# Patient Record
Sex: Male | Born: 1955
Health system: Southern US, Community
[De-identification: ages and names within clinical notes are randomized; demographics above are authoritative.]

## PROBLEM LIST (undated history)

## (undated) DIAGNOSIS — Z21 Asymptomatic human immunodeficiency virus [HIV] infection status: Secondary | ICD-10-CM

## (undated) DIAGNOSIS — M1990 Unspecified osteoarthritis, unspecified site: Secondary | ICD-10-CM

## (undated) DIAGNOSIS — M722 Plantar fascial fibromatosis: Secondary | ICD-10-CM

## (undated) DIAGNOSIS — Z86010 Personal history of colonic polyps: Secondary | ICD-10-CM

## (undated) DIAGNOSIS — B20 Human immunodeficiency virus [HIV] disease: Secondary | ICD-10-CM

## (undated) HISTORY — DX: Asymptomatic human immunodeficiency virus (hiv) infection status: Z21

## (undated) HISTORY — DX: Personal history of colonic polyps: Z86.010

## (undated) HISTORY — PX: COLONOSCOPY: SHX174

## (undated) HISTORY — PX: TONSILLECTOMY: SUR1361

## (undated) HISTORY — PX: PROSTATE BIOPSY: SHX241

## (undated) HISTORY — DX: Unspecified osteoarthritis, unspecified site: M19.90

## (undated) HISTORY — DX: Plantar fascial fibromatosis: M72.2

## (undated) HISTORY — DX: Human immunodeficiency virus (HIV) disease: B20

## (undated) HISTORY — PX: HEMORRHOID BANDING: SHX5850

---

## 2002-11-01 ENCOUNTER — Ambulatory Visit (HOSPITAL_BASED_OUTPATIENT_CLINIC_OR_DEPARTMENT_OTHER): Admission: RE | Admit: 2002-11-01 | Discharge: 2002-11-01 | Payer: Self-pay | Admitting: General Surgery

## 2002-12-01 ENCOUNTER — Encounter: Payer: Self-pay | Admitting: Internal Medicine

## 2002-12-01 ENCOUNTER — Inpatient Hospital Stay (HOSPITAL_COMMUNITY): Admission: EM | Admit: 2002-12-01 | Discharge: 2002-12-05 | Payer: Self-pay | Admitting: Emergency Medicine

## 2003-07-14 ENCOUNTER — Ambulatory Visit (HOSPITAL_COMMUNITY): Admission: RE | Admit: 2003-07-14 | Discharge: 2003-07-14 | Payer: Self-pay | Admitting: Hematology & Oncology

## 2011-05-01 ENCOUNTER — Telehealth: Payer: Self-pay

## 2011-05-01 ENCOUNTER — Ambulatory Visit (INDEPENDENT_AMBULATORY_CARE_PROVIDER_SITE_OTHER): Payer: BC Managed Care – PPO | Admitting: Emergency Medicine

## 2011-05-01 VITALS — BP 130/84 | HR 90 | Temp 97.7°F | Resp 16 | Ht 70.0 in | Wt 203.0 lb

## 2011-05-01 DIAGNOSIS — Z21 Asymptomatic human immunodeficiency virus [HIV] infection status: Secondary | ICD-10-CM

## 2011-05-01 DIAGNOSIS — B20 Human immunodeficiency virus [HIV] disease: Secondary | ICD-10-CM

## 2011-05-01 DIAGNOSIS — Z202 Contact with and (suspected) exposure to infections with a predominantly sexual mode of transmission: Secondary | ICD-10-CM

## 2011-05-01 LAB — HEPATITIS C ANTIBODY: HCV Ab: NEGATIVE

## 2011-05-01 MED ORDER — DOXYCYCLINE HYCLATE 100 MG PO TABS: 100.0000 mg | ORAL_TABLET | Freq: Two times a day (BID) | ORAL | Status: AC

## 2011-05-01 MED ORDER — CEFTRIAXONE SODIUM 1 G IJ SOLR
250.0000 mg | Freq: Once | INTRAMUSCULAR | Status: AC
  Administered 2011-05-01: 250 mg via INTRAMUSCULAR

## 2011-05-01 MED ORDER — CEFTRIAXONE SODIUM 250 MG IJ SOLR: 250.0000 mg | Freq: Once | INTRAMUSCULAR | Status: DC

## 2011-05-01 MED ORDER — VALACYCLOVIR HCL 1 G PO TABS: 1000.0000 mg | ORAL_TABLET | Freq: Two times a day (BID) | ORAL | Status: DC

## 2011-05-01 NOTE — Progress Notes (Signed)
  Subjective:    Patient ID: Charles [name], male    DOB: 12/18/1955, 56 y.o.   MRN: [id number]  HPI patient enters after having anal intercourse . Following this the patient developed a painful rash around his anal area. He says his partner did use a condom. He is known to be HIV positive. Partner told him he tested positive for gonorrhea and Chlamydia about 2 months ago. He is under treatment for his HIV disease for a ID physician in [place]. His last CD4 count was 650.    Review of Systems he overall feels fine his only complaint is of a painful rash  is around his anal area.     Objective:   Physical Exam physical exam is limited to the genitals there are ulcerations present to the left of the anal area a swab was done for gonorrhea and Chlamydia from the anus. A Gen-Probe was also obtained. The ulcerations are swab and sent for HSV culture..        Assessment & Plan:  Assessment his perianal ulcerations in an HIV-positive male. Patient is to have had contact with the patient who had gonorrhea and Chlamydia. We'll do all his cultures but not a HIV test because he is known to be  HIV-positive. We'll give IM Rocephin treatment doxy and.valtrex.

## 2011-05-01 NOTE — Progress Notes (Signed)
Addended by: Lesle Chris A on: 05/01/2011 08:47 AM   Modules accepted: Orders

## 2011-05-02 LAB — HSV(HERPES SIMPLEX VRS) I + II AB-IGG: HSV 1 Glycoprotein G Ab, IgG: 3.76 IV — ABNORMAL HIGH

## 2011-05-02 LAB — GC/CHLAMYDIA PROBE AMP, GENITAL: Chlamydia, DNA Probe: NEGATIVE

## 2011-05-04 LAB — WOUND CULTURE
Gram Stain: NONE SEEN
Organism ID, Bacteria: NORMAL

## 2011-12-06 ENCOUNTER — Ambulatory Visit (INDEPENDENT_AMBULATORY_CARE_PROVIDER_SITE_OTHER): Payer: BC Managed Care – PPO | Admitting: Family Medicine

## 2011-12-06 VITALS — BP 122/81 | HR 63 | Temp 98.3°F | Resp 16 | Ht 70.0 in | Wt 205.6 lb

## 2011-12-06 DIAGNOSIS — L0291 Cutaneous abscess, unspecified: Secondary | ICD-10-CM

## 2011-12-06 DIAGNOSIS — A6000 Herpesviral infection of urogenital system, unspecified: Secondary | ICD-10-CM

## 2011-12-06 DIAGNOSIS — Z202 Contact with and (suspected) exposure to infections with a predominantly sexual mode of transmission: Secondary | ICD-10-CM

## 2011-12-06 MED ORDER — CEPHALEXIN 500 MG PO CAPS: 500.0000 mg | ORAL_CAPSULE | Freq: Four times a day (QID) | ORAL | Status: DC

## 2011-12-06 MED ORDER — VALACYCLOVIR HCL 1 G PO TABS: 1000.0000 mg | ORAL_TABLET | Freq: Two times a day (BID) | ORAL | Status: DC

## 2011-12-06 MED ORDER — SULFAMETHOXAZOLE-TRIMETHOPRIM 800-160 MG PO TABS: 2.0000 | ORAL_TABLET | Freq: Two times a day (BID) | ORAL | Status: DC

## 2011-12-06 NOTE — Progress Notes (Signed)
   Patient ID: Charles [name] MRN: [id number], DOB: 1955-05-15, 56 y.o. Date of Encounter: 12/06/2011, 6:53 PM    PROCEDURE NOTE: Verbal consent obtained. Betadine prep per usual protocol. Local anesthesia obtained with 1% plain lidocaine.  Small incision made with 11 blade along lesion.  Culture taken. Small amount of purulence expressed. Wound debrided. Irrigated with normal saline. Dressed. Wound care instructions including precautions with patient. Patient tolerated the procedure well.  SignedEula Listen, PA-C 12/06/2011 6:53 PM

## 2011-12-06 NOTE — Progress Notes (Signed)
  Subjective:    Patient ID: Charles [name], male    DOB: October 19, 1955, 56 y.o.   MRN: [id number] Chief Complaint  Patient presents with  . Cellulitis    right elbow    HPI  Has had mass on elbow that has become progressively swollen, warm, tender over the past wk.  Has not drained anything. No prob w/ elbow before the started or pre-existing masses.  Past Medical History  Diagnosis Date  . HIV positive   . Arthritis    Current Outpatient Prescriptions on File Prior to Visit  Medication Sig Dispense Refill  . Multiple Vitamin (MULTIVITAMIN) tablet Take 1 tablet by mouth daily.       No current facility-administered medications on file prior to visit.   No Known Allergies   Review of Systems  Constitutional: Negative for fever, chills, activity change and appetite change.  Cardiovascular: Negative for leg swelling.  Gastrointestinal: Negative for nausea, vomiting, abdominal pain, diarrhea and constipation.  Musculoskeletal: Positive for myalgias. Negative for joint swelling and gait problem.  Skin: Positive for rash and wound.  Neurological: Negative for weakness and numbness.  Hematological: Negative for adenopathy. Does not bruise/bleed easily.      BP 122/81  Pulse 63  Temp(Src) 98.3 F (36.8 C) (Oral)  Resp 16  Ht 5\' 10"  (1.778 m)  Wt 205 lb 9.6 oz (93.26 kg)  BMI 29.5 kg/m2 Objective:   Physical Exam  Constitutional: He is oriented to person, place, and time. He appears well-developed and well-nourished. No distress.  HENT:  Head: Normocephalic and atraumatic.  Eyes: No scleral icterus.  Pulmonary/Chest: Effort normal.  Neurological: He is alert and oriented to person, place, and time.  Skin: Skin is warm and dry. Rash noted. Rash is macular and nodular. He is not diaphoretic. There is erythema.  Right elbow with erythema, warmth, tender and central fluctuance.  Psychiatric: He has a normal mood and affect. His behavior is normal.          Assessment &  Plan:  Cellulitis and abscess - Plan: cephALEXin (KEFLEX) 500 MG capsule, sulfamethoxazole-trimethoprim (BACTRIM DS,SEPTRA DS) 800-160 MG per tablet, Wound culture - Will double cover with antibiotics since pt may be immunosupressed as PMHx notes HIV +.  S/p I&D by Alycia Rossetti today.  Herpes genitalia - Plan: valACYclovir (VALTREX) 1000 MG tablet refilled  Meds ordered this encounter  Medications  . valACYclovir (VALTREX) 1000 MG tablet    Sig: Take 1 tablet (1,000 mg total) by mouth 2 (two) times daily.    Dispense:  20 tablet    Refill:  11  . cephALEXin (KEFLEX) 500 MG capsule    Sig: Take 1 capsule (500 mg total) by mouth 4 (four) times daily.    Dispense:  28 capsule    Refill:  1  . sulfamethoxazole-trimethoprim (BACTRIM DS,SEPTRA DS) 800-160 MG per tablet    Sig: Take 2 tablets by mouth 2 (two) times daily.    Dispense:  40 tablet    Refill:  0

## 2011-12-18 ENCOUNTER — Ambulatory Visit (INDEPENDENT_AMBULATORY_CARE_PROVIDER_SITE_OTHER): Payer: BC Managed Care – PPO | Admitting: Family Medicine

## 2011-12-18 ENCOUNTER — Encounter: Payer: Self-pay | Admitting: Family Medicine

## 2011-12-18 VITALS — BP 118/76 | HR 75 | Temp 98.2°F | Resp 16 | Ht 70.0 in | Wt 198.8 lb

## 2011-12-18 DIAGNOSIS — L723 Sebaceous cyst: Secondary | ICD-10-CM

## 2011-12-18 DIAGNOSIS — L0291 Cutaneous abscess, unspecified: Secondary | ICD-10-CM

## 2011-12-18 DIAGNOSIS — L0390 Cellulitis, unspecified: Secondary | ICD-10-CM

## 2011-12-18 DIAGNOSIS — L02419 Cutaneous abscess of limb, unspecified: Secondary | ICD-10-CM

## 2011-12-18 NOTE — Progress Notes (Signed)
  Subjective:    Patient ID: Charles [name], male    DOB: 1956-01-26, 56 y.o.   MRN: [id number]  HPI Charles [name] is a 56 y.o. male  Treated 12/06/11 for R elbow abcess with I and D, Septra 2 BID for 10 days. Culture grew Staph.  Improved - seems to be ok now, no further pus - last expressed 4 days ago.  Pain resolved - finished antibiotic 2 days ago.  Today - c/o cyst on back left side of scalp - noticed about 2 weeks ago - seemed to slightly improve, then slightly larger in past 1 week.  No by much.  No pain, no soreness with pressure.  No discharge.   Possible similar cyst years ago - thinks it resolved on own.  No treatments to current lesion.   Review of Systems  Constitutional: Negative for fever and chills.  Skin: Negative for rash.  Neurological: Negative for headaches.       Objective:   Physical Exam  Vitals reviewed. Constitutional: He is oriented to person, place, and time. He appears well-developed and well-nourished.  HENT:  Head: Atraumatic.    Right Ear: External ear normal.  Left Ear: External ear normal.  Neck: Normal range of motion.  Musculoskeletal: Normal range of motion.       Right elbow: He exhibits normal range of motion, no swelling and no effusion. no tenderness found.       Arms: Lymphadenopathy:    He has no cervical adenopathy.  Neurological: He is alert and oriented to person, place, and time.  Skin: Skin is warm and dry.  Psychiatric: He has a normal mood and affect. His behavior is normal.       Assessment & Plan:  Charles [name] is a 56 y.o. male Likely sebaceous cyst L occiput  - does not appear infected or painful at this point.  rtc precautions discussed for I and D/infected s/sx.  R elbow cellulitis/abcess.  Improved and resolving off abx.  From. rtc precautions.

## 2012-06-09 ENCOUNTER — Ambulatory Visit (INDEPENDENT_AMBULATORY_CARE_PROVIDER_SITE_OTHER): Payer: BC Managed Care – PPO | Admitting: Family Medicine

## 2012-06-09 VITALS — BP 108/69 | HR 84 | Temp 98.2°F | Resp 16 | Ht 70.0 in | Wt 200.0 lb

## 2012-06-09 DIAGNOSIS — M7021 Olecranon bursitis, right elbow: Secondary | ICD-10-CM

## 2012-06-09 DIAGNOSIS — M7020 Olecranon bursitis, unspecified elbow: Secondary | ICD-10-CM

## 2012-06-09 DIAGNOSIS — R42 Dizziness and giddiness: Secondary | ICD-10-CM

## 2012-06-09 NOTE — Progress Notes (Signed)
  Subjective:    Patient ID: Charles [name], male    DOB: 12/24/1955, 57 y.o.   MRN: [id number] Chief Complaint  Patient presents with  . Joint Swelling    Rt elbow- 2 weeks- NKI- not really painful    HPI  For the past 2 wks, has had progressive swelling over his Rt elbow.  Slight limitation in complete flexion. No pain, no known injury. Has never had anything prior though did have an abscess over Rt elbow requiring I&D 6 mos prev - completely resolved.  Has been taking claritin -D daily for allergies but forgot it today.  Has felt a little lightheaded and nauseas today.   Past Medical History  Diagnosis Date  . HIV positive   . Arthritis    Current Outpatient Prescriptions on File Prior to Visit  Medication Sig Dispense Refill  . Multiple Vitamin (MULTIVITAMIN) tablet Take 1 tablet by mouth daily.      . valACYclovir (VALTREX) 1000 MG tablet Take 1 tablet (1,000 mg total) by mouth 2 (two) times daily.  20 tablet  11   No current facility-administered medications on file prior to visit.   No Known Allergies   Review of Systems  Constitutional: Negative for fever, chills, diaphoresis, activity change and appetite change.  Musculoskeletal: Positive for joint swelling. Negative for myalgias, arthralgias and gait problem.  Skin: Negative for color change, pallor, rash and wound.  Neurological: Negative for weakness and numbness.  Hematological: Does not bruise/bleed easily.       BP 108/69  Pulse 84  Temp(Src) 98.2 F (36.8 C) (Oral)  Resp 16  Ht 5\' 10"  (1.778 m)  Wt 200 lb (90.719 kg)  BMI 28.7 kg/m2  SpO2 98% Objective:   Physical Exam  Constitutional: He is oriented to person, place, and time. He appears well-developed and well-nourished. No distress.  HENT:  Head: Normocephalic and atraumatic.  Eyes: No scleral icterus.  Pulmonary/Chest: Effort normal.  Musculoskeletal:       Right elbow: He exhibits decreased range of motion and effusion. He exhibits no  deformity and no laceration. No tenderness found.  Well-defined effusion over olecranon process. Mild erythema, no warmth or tenderness.  Neurological: He is alert and oriented to person, place, and time.  Skin: Skin is warm and dry. He is not diaphoretic.  Psychiatric: He has a normal mood and affect. His behavior is normal.   Right elbow cleaned w/ alcohol x 1 followed by betadine x 2.  Anesthesia with ethyl chloride followed by aspiration with 20g needle - withdrew 7.5cc of clear red serosanguinous fluid w/o comp.  Pt tol procedure well.    Assessment & Plan:  Olecranon bursitis of right elbow - Plan: Body Fluid Crystal, Body fluid cell count with differential, Gram Stain/Body Fluid Culture, Body fluid culture, Cell count + diff,  w/ cryst-synvl fld - suspect benign bursitis so drained approx 7.5 cc from elbow but due to h/o recent infection will send off for studies in case of recurrence - if studies benign we can inject bursa w/ corticosteriod after drainage.  Rec ace wrap for compression and tid icing for 1-2 wks.  Lightheadedness - resolved within 5 min after procedure. Cont to monitor and RTC if sxs recur.    Meds ordered this encounter  Medications  . loratadine-pseudoephedrine (CLARITIN-D 24-HOUR) 10-240 MG per 24 hr tablet    Sig: Take 1 tablet by mouth daily.

## 2012-06-09 NOTE — Patient Instructions (Addendum)
Olecranon Bursitis Bursitis is swelling and soreness (inflammation) of a fluid-filled sac (bursa) that covers and protects a joint. Olecranon bursitis occurs over the elbow.  CAUSES Bursitis can be caused by injury, overuse of the joint, arthritis, or infection.  SYMPTOMS   Tenderness, swelling, warmth, or redness over the elbow.  Elbow pain with movement. This is greater with bending the elbow.  Squeaking sound when the bursa is rubbed or moved.  Increasing size of the bursa without pain or discomfort.  Fever with increasing pain and swelling if the bursa becomes infected. HOME CARE INSTRUCTIONS   Put ice on the affected area.  Put ice in a plastic bag.  Place a towel between your skin and the bag.  Leave the ice on for 15 to 20 minutes each hour while awake. Do this for the first 2 days.  When resting, elevate your elbow above the level of your heart. This helps reduce swelling.  Continue to put the joint through a full range of motion 4 times per day. Rest the injured joint at other times. When the pain lessens, begin normal slow movements and usual activities.  Only take over-the-counter or prescription medicines for pain, discomfort, or fever as directed by your caregiver.  Reduce your intake of milk and related dairy products (cheese, yogurt). They may make your condition worse. SEEK IMMEDIATE MEDICAL CARE IF:   Your pain increases even during treatment.  You have a fever.  You have heat and inflammation over the bursa and elbow.  You have a red line that goes up your arm.  You have pain with movement of your elbow. MAKE SURE YOU:   Understand these instructions.  Will watch your condition.  Will get help right away if you are not doing well or get worse. Document Released: 02/26/2006 Document Revised: 04/21/2011 Document Reviewed: 01/12/2007 [employer] Patient Information 2013 [employer].  

## 2012-06-11 LAB — SYNOVIAL CELL COUNT + DIFF, W/ CRYSTALS
Lymphocytes-Synovial Fld: 12 % (ref 0–20)
Neutrophil, Synovial: 10 % (ref 0–25)
WBC, Synovial: 505 cu mm — ABNORMAL HIGH (ref 0–200)

## 2012-06-13 ENCOUNTER — Telehealth: Payer: Self-pay

## 2012-06-13 NOTE — Telephone Encounter (Signed)
Pt states that he had a missed call and believes it has something to do with his elbow Call back number is [phone]

## 2012-06-14 ENCOUNTER — Telehealth: Payer: Self-pay

## 2012-06-14 NOTE — Telephone Encounter (Signed)
Pt is calling for lab results 

## 2012-06-14 NOTE — Telephone Encounter (Signed)
See labs 

## 2012-06-14 NOTE — Telephone Encounter (Signed)
Recorded by Sherren Mocha, MD on 06/13/2012 at 1:55 PM Elbow fluid just showed inflammation but no crystals (no gout or pseudogout) and no infection.

## 2012-06-14 NOTE — Telephone Encounter (Signed)
Left message for him to call back for lab results/ see labs.

## 2012-06-16 ENCOUNTER — Encounter: Payer: Self-pay | Admitting: Radiology

## 2012-06-27 ENCOUNTER — Ambulatory Visit (INDEPENDENT_AMBULATORY_CARE_PROVIDER_SITE_OTHER): Payer: BC Managed Care – PPO | Admitting: Family Medicine

## 2012-06-27 VITALS — BP 98/74 | HR 103 | Temp 98.5°F | Resp 16 | Ht 68.75 in | Wt 194.4 lb

## 2012-06-27 DIAGNOSIS — M7021 Olecranon bursitis, right elbow: Secondary | ICD-10-CM

## 2012-06-27 DIAGNOSIS — M7020 Olecranon bursitis, unspecified elbow: Secondary | ICD-10-CM

## 2012-06-27 DIAGNOSIS — M79609 Pain in unspecified limb: Secondary | ICD-10-CM

## 2012-06-27 NOTE — Patient Instructions (Addendum)
Olecranon Bursitis  °with Rehab °A bursa is a fluid filled sac that is located between soft tissues (ligaments, tendons, skin) and bones. The purpose of a bursa is to allow the soft tissue to function smoothly, without friction. The olecrenon bursa is located between the back of the elbow (olecrenon) and the skin. Olecrenon bursitis involves inflammation of this bursa, resulting in pain. °SYMPTOMS  °· Pain, tenderness, swelling, warmth, or redness over the back of the elbow. °· Reduced range of motion of the affected elbow. °· Sometimes, severe pain with movement of the affected elbow. °· Crackling sound (crepitation) when the bursa is moved or touched. °· Often, painless swelling of the bursa. °· Fever (when infected). °CAUSES  °Olecranon bursitis is often caused by direct hit (trauma) to the elbow. Less commonly, it is due to overuse and/or strenuous exercise that the elbow is not used to. °RISK INCREASES WITH: °· Sports that require bending or landing on the elbow (football, volleyball). °· Vigorous or repetitive athletic training, or sudden increase or change in activity level (weekend warriors). °· Failure to warm up properly before activity. °· Poor exercise technique. °· Playing on artificial turf. °PREVENTION °· Avoid injuries and the overuse of muscles whenever possible. °· Warm up and stretch properly before activity. °· Allow for adequate recovery between workouts. °· Maintain physical fitness: °· Strength, flexibility, and endurance. °· Cardiovascular fitness. °· Learn and use proper technique. °· Wear properly fitted and padded protective equipment. °PROGNOSIS  °If treated properly, olecranon bursitis is usually curable within 2 weeks.  °RELATED COMPLICATIONS  °· Longer healing time, if not properly treated or if not given enough time to heal. °· Recurring symptoms that result in a chronic problem. °· Joint stiffness with permanent limitation of the affected joint's movement. °· Infection of the  bursa. °· Chronic inflammation or scarring of the bursa. °TREATMENT °Treatment first involves the use of ice and medicine, to reduce pain and inflammation. The use of strengthening and stretching exercises may help reduce pain with activity. These exercises may be performed at home or with a therapist. Elbow pads may be advised, to protect the bursa. If symptoms persist, despite non-surgical treatment, a procedure to withdraw fluid from the bursa may be advised. This procedure may be accompanied with an injection of corticosteroids, to reduce inflammation. Sometimes, surgery is needed to remove the bursa. °MEDICATION °· If pain medicine is needed, nonsteroidal anti-inflammatory medicines (aspirin and ibuprofen), or other minor pain relievers (acetaminophen), are often advised. °· Do not take pain medicine for 7 days before surgery. °· Prescription pain relievers may be given, if your caregiver thinks they are needed. Use only as directed and only as much as you need. °· Corticosteroid injections may be given by your caregiver. These injections should be reserved for the most serious cases, because they may only be given a certain number of times. °HEAT AND COLD °· Cold treatment (icing) should be applied for 10 to 15 minutes every 2 to 3 hours for inflammation and pain, and immediately after activity that aggravates your symptoms. Use ice packs or an ice massage. °· Heat treatment may be used before performing stretching and strengthening activities prescribed by your caregiver, physical therapist, or athletic trainer. Use a heat pack or a warm water soak. °SEEK IMMEDIATE MEDICAL CARE IF:  °· Symptoms get worse or do not improve in 2 weeks, despite treatment. °· Signs of infection develop, including fever of 102° F (38.9° C), increased pain, redness, warmth, or pus draining from   the bursa. °· New, unexplained symptoms develop. (Drugs used in treatment may produce side effects.) °EXERCISES  °RANGE OF MOTION (ROM) AND  STRETCHING EXERCISES - Olecranon Bursitis °These exercises may help you when beginning to rehabilitate your injury. Your symptoms may resolve with or without further involvement from your physician, physical therapist or athletic trainer. While completing these exercises, remember:  °· Restoring tissue flexibility helps normal motion to return to the joints. This allows healthier, less painful movement and activity. °· An effective stretch should be held for at least 30 seconds. °· A stretch should never be painful. You should only feel a gentle lengthening or release in the stretched tissue. °RANGE OF MOTION  Elbow Flexion, Supine °· Lie on your back. Extend your right / left arm into the air, bracing it with your opposite hand. Allow your right / left arm to relax. °· Let your elbow bend, allowing your hand to fall slowly toward your chest. °· You should feel a gentle stretch along the back of your upper arm and elbow. Your physician, physical therapist or athletic trainer may ask you to hold a __________ hand weight to increase the intensity of this stretch. °· Hold for __________ seconds. Slowly return your right / left arm to the upright position. °Repeat __________ times. Complete this exercise __________ times per day. °STRETCH  Elbow Flexors °· Lie on a firm bed or countertop on your back. Be sure that you are in a comfortable position which will allow you to relax your arm muscles. °· Place a folded towel under your right / left upper arm, so that your elbow and shoulder are at the same height. Extend your arm; your elbow should not rest on the bed or towel °· Allow the weight of your hand to straighten your elbow. Keep your arm and chest muscles relaxed. Your caregiver may ask you to increase the intensity of your stretch by adding a small wrist or hand weight. °· Hold for __________ seconds. You should feel a stretch on the inside of your elbow. Slowly return to the starting position. °Repeat __________  times. Complete this exercise __________ times per day. °STRENGTHENING EXERCISES - Olecranon Bursitis °These exercises will help you regain your strength. They may resolve your symptoms with or without further involvement from your physician, physical therapist or athletic trainer. While completing these exercises, remember:  °· Muscles can gain both the endurance and the strength needed for everyday activities through controlled exercises. °· Complete these exercises as instructed by your physician, physical therapist or athletic trainer. Increase the resistance and repetitions only as guided by your caregiver. °· You may experience muscle soreness or fatigue, but the pain or discomfort you are trying to eliminate should never worsen during these exercises. If this pain does worsen, stop and make certain you are following the directions exactly. If the pain is still present after adjustments, discontinue the exercise until you can discuss the trouble with your caregiver. °STRENGTH - Elbow Extensors, Isometric °· Stand or sit upright on a firm surface. Place your right / left arm so that your palm faces your stomach, and it is at the height of your waist. °· Place your opposite hand on the underside of your forearm. Gently push up as your right / left arm resists. Push as hard as you can with both arms without causing any pain or movement at your right / left elbow. Hold this stationary position for __________ seconds. °· Gradually release the tension in both arms. Allow   your muscles to relax completely before repeating. Repeat __________ times. Complete this exercise __________ times per day. STRENGTH - Elbow Flexors, Isometric  Stand or sit upright on a firm surface. Place your right / left arm so that your hand is palm-up and at the height of your waist.  Place your opposite hand on top of your forearm. Gently push down as your right / left arm resists. Push as hard as you can with both arms without causing  any pain or movement at your right / left elbow. Hold this stationary position for __________ seconds.  Gradually release the tension in both arms. Allow your muscles to relax completely before repeating. Repeat __________ times. Complete this exercise __________ times per day. STRENGTH  Elbow Flexors, Supinated  With good posture, stand or sit on a firm chair without armrests. Allow your right / left arm to rest at your side with your palm facing forward.  Holding a __________ weight, or gripping a rubber exercise band or tubing,  bring your hand toward your shoulder.  Allow your muscles to control the resistance as your hand returns to your side. Repeat __________ times. Complete this exercise __________ times per day.  STRENGTH  Elbow Flexors, Neutral  With good posture, stand or sit on a firm chair without armrests. Allow your right / left arm to rest at your side with your thumb facing forward.  Holding a __________weight, or gripping a rubber exercise band or tubing,  bring your hand toward your shoulder.  Allow your muscles to control the resistance as your hand returns to your side. Repeat __________ times. Complete this exercise __________ times per day.  STRENGTH  Elbow Extensors  Lie on your back. Extend your right / left elbow into the air, pointing it toward the ceiling. Brace your arm with your opposite hand.*  Holding a __________ weight in your hand, slowly straighten your right / left elbow.  Allow your muscles to control the weight as your hand returns to its starting position. Repeat __________ times. Complete this exercise __________ times per day. *You may also stand with your elbow overhead and pointed toward the ceiling, supported by your opposite hand. STRENGTH - Elbow Extensors, Dynamic  With good posture, stand, or sit on a firm chair without armrests. Keeping your upper arms at your side, bring both hands up to your right / left shoulder while gripping a  rubber exercise band or tubing. Your right / left hand should be just below the other hand.  Straighten your right / left elbow. Hold for __________ seconds.  Allow your muscles to control the rubber exercise band, as your hand returns to your shoulder. Repeat __________ times. Complete this exercise __________ times per day. Document Released: 01/27/2005 Document Revised: 04/21/2011 Document Reviewed: 05/11/2008 [facility] Patient Information 2013 [place], [place].

## 2012-06-27 NOTE — Progress Notes (Signed)
  Subjective:    Patient ID: Charles [name], male    DOB: 1956-02-07, 57 y.o.   MRN: [id number] Chief Complaint  Patient presents with  . Follow-up    fluid on right elbow   HPI  Charles [name] is doing well but his olecranon bursitis has returned completely.  He reports that after about 3d the bursa swelling came back despite wearing the ace wrap so he stopped wearing his ace wrap about a wk ago when the olecranon bursitis fully recurred. Has been conscious of not putting pressure on table, not resting on elbow. Admits he could have been better about the frequency of icing.  Past Medical History  Diagnosis Date  . HIV positive   . Arthritis    Current Outpatient Prescriptions on File Prior to Visit  Medication Sig Dispense Refill  . loratadine-pseudoephedrine (CLARITIN-D 24-HOUR) 10-240 MG per 24 hr tablet Take 1 tablet by mouth daily.      . Multiple Vitamin (MULTIVITAMIN) tablet Take 1 tablet by mouth daily.      . valACYclovir (VALTREX) 1000 MG tablet Take 1 tablet (1,000 mg total) by mouth 2 (two) times daily.  20 tablet  11   No current facility-administered medications on file prior to visit.   No Known Allergies   Review of Systems  Constitutional: Negative for fever, chills, diaphoresis and activity change.  Cardiovascular: Negative for leg swelling.  Musculoskeletal: Positive for joint swelling and arthralgias. Negative for myalgias.  Skin: Positive for color change. Negative for pallor, rash and wound.  Hematological: Negative for adenopathy. Does not bruise/bleed easily.      BP 98/74  Pulse 103  Temp(Src) 98.5 F (36.9 C) (Oral)  Resp 16  Ht 5' 8.75" (1.746 m)  Wt 194 lb 6.4 oz (88.179 kg)  BMI 28.93 kg/m2  SpO2 98% Objective:   Physical Exam  Constitutional: He is oriented to person, place, and time. He appears well-developed and well-nourished. No distress.  HENT:  Head: Normocephalic and atraumatic.  Eyes: No scleral icterus.  Pulmonary/Chest: Effort  normal.  Musculoskeletal:       Right elbow: He exhibits swelling and effusion. He exhibits normal range of motion. No tenderness found.  Well defined, soft olecranon bursa - smaller than a golf-ball.  Neurological: He is alert and oriented to person, place, and time.  Skin: Skin is warm and dry. He is not diaphoretic.  Psychiatric: He has a normal mood and affect. His behavior is normal.     Verbal consent obtained.  Right elbow cleaned with betadine x 2. Anesthesia w/ ethyl chloride cold spray. Aspiration of bright red serosanguinous fluid - approx 1 cc using 22g 1 1/2 needle followed by injection with 20mg  of Kenalog and 2cc 1% lidocaine w/o complication. Pt tolerated procedure well. Assessment & Plan:  Olecranon bursitis of right elbow Aspirated today followed by steroid injection.  Rec aleve 2 tabs bid and tid ice x 1 wk.  Leave ace wrap in place for the next week. Avoid any trauma or pressure to elbow.  Warned of possibilities of steroid flair or infection into elbow - call or RTC immed for any increased pain, redness, swelling, or warmth.

## 2012-10-05 ENCOUNTER — Ambulatory Visit (INDEPENDENT_AMBULATORY_CARE_PROVIDER_SITE_OTHER): Payer: BC Managed Care – PPO | Admitting: Family Medicine

## 2012-10-05 ENCOUNTER — Encounter: Payer: Self-pay | Admitting: Family Medicine

## 2012-10-05 VITALS — BP 122/70 | HR 87 | Temp 99.1°F | Resp 16 | Ht 69.0 in | Wt 203.0 lb

## 2012-10-05 DIAGNOSIS — R21 Rash and other nonspecific skin eruption: Secondary | ICD-10-CM

## 2012-10-05 DIAGNOSIS — L309 Dermatitis, unspecified: Secondary | ICD-10-CM

## 2012-10-05 DIAGNOSIS — L259 Unspecified contact dermatitis, unspecified cause: Secondary | ICD-10-CM

## 2012-10-05 MED ORDER — MUPIROCIN 2 % EX OINT: TOPICAL_OINTMENT | Freq: Three times a day (TID) | CUTANEOUS | Status: DC

## 2012-10-05 MED ORDER — TRIAMCINOLONE ACETONIDE 0.1 % EX CREA: TOPICAL_CREAM | Freq: Three times a day (TID) | CUTANEOUS | Status: DC

## 2012-10-05 NOTE — Progress Notes (Signed)
[facility]:  Office Visit  Chief Complaint:  Chief Complaint  Patient presents with  . Blister    blisters on both hands x 2 weeks blister on left foot x 2 days    HPI: Charles [name] is a 57 y.o. male who complains of : 2-3 week history of blisters on both his hands, strated on his left then went to his right, and then he had 1 lesion on his left hand that popped up today, he came in because he now has a flat rash on the bottom of his left  foot. Only tender when he touches the areas, denies burning, numbness, tingling. He has had plantar warts onhis foot in the past He has been remodeling his rental house and thought he may have gotten a splinter in it No new meds, no new detergents, no creams, soaps He has no tick bites, but has had mosquito bites He has tried putting neosporin on it but he blisters would dry out and then become callus, however they are not going away  Of pertinent interest: + HSV 1 and 2 + HIV x 10 years His HIV is well controlled, 1 month ago CD4 count was 600  Has not had any URI sxs or mouth blisters No contact with any children with similar symptoms c/w Coxsackie Denies fevers, chills, unintentional weightloss, swollen glands This has never happened before  Past Medical History  Diagnosis Date  . HIV positive   . Arthritis    History reviewed. No pertinent past surgical history. History   Social History  . Marital Status: Married    Spouse Name: N/A    Number of Children: N/A  . Years of Education: N/A   Social History Main Topics  . Smoking status: Never Smoker   . Smokeless tobacco: None  . Alcohol Use: No  . Drug Use: No  . Sexual Activity: None   Other Topics Concern  . None   Social History Narrative  . None   Family History  Problem Relation Age of Onset  . Pancreatic cancer Mother    No Known Allergies Prior to Admission medications   Medication Sig Start Date End Date Taking? Authorizing Provider   Multiple Vitamin (MULTIVITAMIN) tablet Take 1 tablet by mouth daily.   Yes Historical Provider, MD  loratadine-pseudoephedrine (CLARITIN-D 24-HOUR) 10-240 MG per 24 hr tablet Take 1 tablet by mouth daily.    Historical Provider, MD  valACYclovir (VALTREX) 1000 MG tablet Take 1 tablet (1,000 mg total) by mouth 2 (two) times daily. 12/06/11 12/05/12  Sherren Mocha, MD     ROS: The patient denies fevers, chills, night sweats, unintentional weight loss, chest pain, palpitations, wheezing, dyspnea on exertion, nausea, vomiting, abdominal pain, dysuria, hematuria, melena, numbness, weakness, or tingling.   All other systems have been reviewed and were otherwise negative with the exception of those mentioned in the HPI and as above.    PHYSICAL EXAM: Filed Vitals:   10/05/12 1825  BP: 122/70  Pulse: 87  Temp: 99.1 F (37.3 C)  Resp: 16   Filed Vitals:   10/05/12 1825  Height: 5\' 9"  (1.753 m)  Weight: 203 lb (92.08 kg)   Body mass index is 29.96 kg/(m^2).  General: Alert, no acute distress HEENT:  Normocephalic, atraumatic, oropharynx patent. EOMI, PERRLA, no exudates, no vesicles, no erythema Cardiovascular:  Regular rate and rhythm, no rubs murmurs or gallops.  No Carotid bruits, radial pulse intact. No pedal edema.  Respiratory:  Clear to auscultation bilaterally.  No wheezes, rales, or rhonchi.  No cyanosis, no use of accessory musculature GI: No organomegaly, abdomen is soft and non-tender, positive bowel sounds.  No masses. Skin: + rashes on hand and 1 on foot Neurologic: Facial musculature symmetric. Psychiatric: Patient is appropriate throughout our interaction. Lymphatic: No cervical lymphadenopathy Musculoskeletal: Gait intact.   LABS: Results for orders placed in visit on 06/09/12  BODY FLUID CULTURE      Result Value Range   Gram Stain No WBC Seen     Gram Stain No Organisms Seen     Organism ID, Bacteria NO GROWTH 3 DAYS    CELL COUNT + DIFF,  W/ CRYST-SYNVL FLD       Result Value Range   Color, Synovial RED (*) YELLOW   Appearance-Synovial CLOUDY (*) CLEAR   WBC, Synovial 505 (*) 0 - 200 cu mm   Neutrophil, Synovial 10  0 - 25 %   Lymphocytes-Synovial Fld 12  0 - 20 %   Monocyte/Macrophage 70  50 - 90 %   Eosinophils-Synovial 8 (*) 0 - 1 %   Crystals, Fluid No crystals seen       EKG/XRAY:   Primary read interpreted by Dr. Conley Rolls at [facility].   ASSESSMENT/PLAN: Encounter Diagnoses  Name Primary?  . Dermatitis Yes  . Acute skin eruption of discoloration, elevations, blisters    ? Etiology Contact dermatitis vs  viral warts vs less likely HSV, Coxsackie,  Bullous pemhigoid or KS lesions I am unsure of the etiology, we will try a trial of bactroban If this does not help then consider topical steroid I attempted to shave the callus down but it did not see any seeds distinctive of verruca planus Rx Bactroban Rx Steroid cream Picture taken of lesions F/u sooner if worsening sxs Gross sideeffects, risk and benefits, and alternatives of medications d/w patient. Patient is aware that all medications have potential sideeffects and we are unable to predict every sideeffect or drug-drug interaction that may occur.  Philip Kotlyar PHUONG, DO 10/06/2012 1:19 PM

## 2012-10-22 ENCOUNTER — Ambulatory Visit (INDEPENDENT_AMBULATORY_CARE_PROVIDER_SITE_OTHER): Payer: BC Managed Care – PPO | Admitting: Family Medicine

## 2012-10-22 ENCOUNTER — Ambulatory Visit: Payer: BC Managed Care – PPO

## 2012-10-22 VITALS — BP 114/80 | HR 77 | Temp 98.6°F | Resp 16 | Ht 68.5 in | Wt 195.4 lb

## 2012-10-22 DIAGNOSIS — R05 Cough: Secondary | ICD-10-CM

## 2012-10-22 DIAGNOSIS — R071 Chest pain on breathing: Secondary | ICD-10-CM

## 2012-10-22 DIAGNOSIS — R059 Cough, unspecified: Secondary | ICD-10-CM

## 2012-10-22 DIAGNOSIS — R21 Rash and other nonspecific skin eruption: Secondary | ICD-10-CM

## 2012-10-22 DIAGNOSIS — R0781 Pleurodynia: Secondary | ICD-10-CM

## 2012-10-22 LAB — POCT CBC
Granulocyte percent: 61.8 %G (ref 37–80)
HCT, POC: 46 % (ref 43.5–53.7)
Hemoglobin: 15 g/dL (ref 14.1–18.1)
Lymph, poc: 1.2 (ref 0.6–3.4)
MCH, POC: 31.1 pg (ref 27–31.2)
MCHC: 32.6 g/dL (ref 31.8–35.4)
MCV: 95.2 fL (ref 80–97)
MID (cbc): 0.4 (ref 0–0.9)
MPV: 6.6 fL (ref 0–99.8)
POC Granulocyte: 2.5 (ref 2–6.9)
POC LYMPH PERCENT: 28.2 % (ref 10–50)
POC MID %: 10 %M (ref 0–12)
Platelet Count, POC: 212 10*3/uL (ref 142–424)
RBC: 4.83 M/uL (ref 4.69–6.13)
RDW, POC: 12.9 %
WBC: 4.1 10*3/uL — AB (ref 4.6–10.2)

## 2012-10-22 MED ORDER — SULFAMETHOXAZOLE-TMP DS 800-160 MG PO TABS: 1.0000 | ORAL_TABLET | Freq: Two times a day (BID) | ORAL | Status: DC

## 2012-10-22 NOTE — Progress Notes (Signed)
[facility]:  Office Visit  Chief Complaint:  Chief Complaint  Patient presents with  . Follow-up    RASH ON HANDS  . Cough    4 DAYS  . Nasal Congestion    HPI: Charles [name] is a 57 y.o. male who complains of : 1. Chest and nasal congestion x 4 days, cough, all started after he was [place], Sat and Sunday , Monday was when he got worse. No fevers, chills, no runny nose. No recent  communal activities. Lives by himself. He is an Pensions consultant. Left sided chest pain with deep breathing and  has tenderness with palpation. Denies SOB/wheezes  2. He is also here for follow-up, he got better on bactroban ointment but not steroid cream. New lesions on forehead. Nothing has changed on hand. RPR 04/22/11 was non reactive.   Past Medical History  Diagnosis Date  . HIV positive   . Arthritis    No past surgical history on file. History   Social History  . Marital Status: Married    Spouse Name: N/A    Number of Children: N/A  . Years of Education: N/A   Social History Main Topics  . Smoking status: Never Smoker   . Smokeless tobacco: None  . Alcohol Use: No  . Drug Use: No  . Sexual Activity: None   Other Topics Concern  . None   Social History Narrative  . None   Family History  Problem Relation Age of Onset  . Pancreatic cancer Mother    No Known Allergies Prior to Admission medications   Medication Sig Start Date End Date Taking? Authorizing Provider  Multiple Vitamin (MULTIVITAMIN) tablet Take 1 tablet by mouth daily.   Yes Historical Provider, MD  mupirocin ointment (BACTROBAN) 2 % Apply topically 3 (three) times daily. 10/05/12  Yes Thao P Le, DO  triamcinolone cream (KENALOG) 0.1 % Apply topically 3 (three) times daily. X 2 weeks 10/05/12  Yes Thao P Le, DO  valACYclovir (VALTREX) 1000 MG tablet Take 1 tablet (1,000 mg total) by mouth 2 (two) times daily. 12/06/11 12/05/12 Yes Sherren Mocha, MD  loratadine-pseudoephedrine (CLARITIN-D 24-HOUR)  10-240 MG per 24 hr tablet Take 1 tablet by mouth daily.    Historical Provider, MD     ROS: The patient denies fevers, chills, night sweats, unintentional weight loss, chest pain, palpitations, wheezing, dyspnea on exertion, nausea, vomiting, abdominal pain, dysuria, hematuria, melena, numbness, weakness, or tingling.   All other systems have been reviewed and were otherwise negative with the exception of those mentioned in the HPI and as above.    PHYSICAL EXAM: Filed Vitals:   10/22/12 1442  BP: 114/80  Pulse: 77  Temp: 98.6 F (37 C)  Resp: 16   Filed Vitals:   10/22/12 1442  Height: 5' 8.5" (1.74 m)  Weight: 195 lb 6.4 oz (88.633 kg)   Body mass index is 29.28 kg/(m^2).  General: Alert, no acute distress HEENT:  Normocephalic, atraumatic, oropharynx patent. EOMI, PERRLA Cardiovascular:  Regular rate and rhythm, no rubs murmurs or gallops.  No Carotid bruits, radial pulse intact. No pedal edema.  Respiratory: Clear to auscultation bilaterally.  No wheezes, rales, or rhonchi.  No cyanosis, no use of accessory musculature GI: No organomegaly, abdomen is soft and non-tender, positive bowel sounds.  No masses. Skin: + maculopapular rash on forehead, folliculitis like lesions on top of head, dry skin color papular lesions on soles of feet and soles of hands,  patient states they now have become corn like sicne using otc neosorin and has improved with bactroban Neurologic: Facial musculature symmetric. Psychiatric: Patient is appropriate throughout our interaction. Lymphatic: No cervical lymphadenopathy Musculoskeletal: Gait intact.   LABS: Results for orders placed in visit on 10/22/12  POCT CBC      Result Value Range   WBC 4.1 (*) 4.6 - 10.2 K/uL   Lymph, poc 1.2  0.6 - 3.4   POC LYMPH PERCENT 28.2  10 - 50 %L   MID (cbc) 0.4  0 - 0.9   POC MID % 10.0  0 - 12 %M   POC Granulocyte 2.5  2 - 6.9   Granulocyte percent 61.8  37 - 80 %G   RBC 4.83  4.69 - 6.13 M/uL    Hemoglobin 15.0  14.1 - 18.1 g/dL   HCT, POC 82.9  56.2 - 53.7 %   MCV 95.2  80 - 97 fL   MCH, POC 31.1  27 - 31.2 pg   MCHC 32.6  31.8 - 35.4 g/dL   RDW, POC 13.0     Platelet Count, POC 212  142 - 424 K/uL   MPV 6.6  0 - 99.8 fL     EKG/XRAY:   Primary read interpreted by Dr. Conley Rolls at [facility]. No obvious infiltrates, pneumo, effusion Bronchitic changes   ASSESSMENT/PLAN: Encounter Diagnoses  Name Primary?  . Rash and nonspecific skin eruption Yes  . Cough   . Pleuritic chest pain    Will give Bactrim since bactroban seems to have helped.  Labs pending: RPR  He will get referred to derm if this is not improved in next 1 week.  Gross sideeffects, risk and benefits, and alternatives of medications d/w patient. Patient is aware that all medications have potential sideeffects and we are unable to predict every sideeffect or drug-drug interaction that may occur.  [name], DO 10/22/2012 3:47 PM

## 2012-10-23 LAB — COMPREHENSIVE METABOLIC PANEL
ALT: 62 U/L — ABNORMAL HIGH (ref 0–53)
AST: 34 U/L (ref 0–37)
Albumin: 4.3 g/dL (ref 3.5–5.2)
Alkaline Phosphatase: 170 U/L — ABNORMAL HIGH (ref 39–117)
Calcium: 9.6 mg/dL (ref 8.4–10.5)
Chloride: 105 mEq/L (ref 96–112)
Creat: 0.89 mg/dL (ref 0.50–1.35)
Potassium: 4.2 mEq/L (ref 3.5–5.3)

## 2012-10-23 LAB — COMPREHENSIVE METABOLIC PANEL WITH GFR
BUN: 14 mg/dL (ref 6–23)
CO2: 26 meq/L (ref 19–32)
Glucose, Bld: 88 mg/dL (ref 70–99)
Sodium: 138 meq/L (ref 135–145)
Total Bilirubin: 3.5 mg/dL — ABNORMAL HIGH (ref 0.3–1.2)
Total Protein: 7.3 g/dL (ref 6.0–8.3)

## 2012-10-23 LAB — RPR TITER: RPR Titer: 1:16 {titer} — AB

## 2012-10-23 LAB — RPR: RPR Ser Ql: REACTIVE — AB

## 2012-10-25 ENCOUNTER — Telehealth: Payer: Self-pay | Admitting: Family Medicine

## 2012-10-25 LAB — T.PALLIDUM AB, TOTAL: T pallidum Antibodies (TP-PA): 2.23 S/CO — ABNORMAL HIGH (ref <0.90)

## 2012-10-25 LAB — WOUND CULTURE
Gram Stain: NONE SEEN
Gram Stain: NONE SEEN
Gram Stain: NONE SEEN
Organism ID, Bacteria: NO GROWTH

## 2012-10-25 LAB — HERPES SIMPLEX VIRUS CULTURE: Organism ID, Bacteria: NOT DETECTED

## 2012-10-25 NOTE — Telephone Encounter (Signed)
LM to call me back regarding labs 

## 2012-10-26 ENCOUNTER — Telehealth: Payer: Self-pay | Admitting: Family Medicine

## 2012-10-26 ENCOUNTER — Ambulatory Visit (INDEPENDENT_AMBULATORY_CARE_PROVIDER_SITE_OTHER): Payer: BC Managed Care – PPO | Admitting: Family Medicine

## 2012-10-26 DIAGNOSIS — A539 Syphilis, unspecified: Secondary | ICD-10-CM

## 2012-10-26 MED ORDER — PENICILLIN G BENZATHINE 2400000 UNIT/4ML IM SUSP: 2.4000 [IU] | Freq: Once | INTRAMUSCULAR | Status: DC

## 2012-10-26 MED ORDER — PENICILLIN G BENZATHINE 1200000 UNIT/2ML IM SUSP
1.2000 10*6.[IU] | Freq: Once | INTRAMUSCULAR | Status: AC
  Administered 2012-10-26: 1.2 10*6.[IU] via INTRAMUSCULAR

## 2012-10-26 MED ORDER — PENICILLIN G BENZATHINE 1200000 UNIT/2ML IM SUSP: 2.4000 10*6.[IU] | Freq: Once | INTRAMUSCULAR | Status: DC

## 2012-10-26 NOTE — Telephone Encounter (Signed)
Gave pt results. He will come in for just injections. He does not need OV, he has had PCN before. He wil stop Bactrim.  Late entry.

## 2012-10-28 NOTE — Progress Notes (Signed)
He is here for PCN Injections. + syphilis. He was advised to get partner tested and also to let his HIV doctor know. This was just an injection for PCN.

## 2012-10-29 ENCOUNTER — Telehealth: Payer: Self-pay

## 2012-10-29 NOTE — Telephone Encounter (Signed)
Spoke with Denyse Amass and gave information on pt for health department.

## 2012-10-29 NOTE — Telephone Encounter (Signed)
Elfredia Nevins surveillance coordinator with [facility] called regarding pt. States pt is positive for syphilis and they need to know circumstances of pts ov when diagnosed.  Asked to speak with dr copland.   Best: [phone]  bf

## 2012-12-16 ENCOUNTER — Encounter: Payer: Self-pay | Admitting: Internal Medicine

## 2012-12-27 ENCOUNTER — Other Ambulatory Visit: Payer: Self-pay | Admitting: Family Medicine

## 2013-02-11 ENCOUNTER — Ambulatory Visit (AMBULATORY_SURGERY_CENTER): Payer: Self-pay

## 2013-02-11 VITALS — Ht 70.0 in | Wt 208.0 lb

## 2013-02-11 DIAGNOSIS — Z1211 Encounter for screening for malignant neoplasm of colon: Secondary | ICD-10-CM

## 2013-02-11 MED ORDER — SUPREP BOWEL PREP KIT 17.5-3.13-1.6 GM/177ML PO SOLN: 1.0000 | Freq: Once | ORAL | Status: DC

## 2013-02-16 ENCOUNTER — Encounter: Payer: Self-pay | Admitting: Internal Medicine

## 2013-02-24 ENCOUNTER — Encounter: Payer: Self-pay | Admitting: Internal Medicine

## 2013-02-24 ENCOUNTER — Ambulatory Visit (AMBULATORY_SURGERY_CENTER): Payer: BC Managed Care – PPO | Admitting: Internal Medicine

## 2013-02-24 VITALS — BP 108/80 | HR 59 | Temp 97.1°F | Resp 23 | Ht 70.0 in | Wt 208.0 lb

## 2013-02-24 DIAGNOSIS — Z860100 Personal history of colon polyps, unspecified: Secondary | ICD-10-CM

## 2013-02-24 DIAGNOSIS — D126 Benign neoplasm of colon, unspecified: Secondary | ICD-10-CM

## 2013-02-24 DIAGNOSIS — Z1211 Encounter for screening for malignant neoplasm of colon: Secondary | ICD-10-CM

## 2013-02-24 HISTORY — DX: Personal history of colon polyps, unspecified: Z86.0100

## 2013-02-24 HISTORY — DX: Personal history of colonic polyps: Z86.010

## 2013-02-24 MED ORDER — SODIUM CHLORIDE 0.9 % IV SOLN: 500.0000 mL | INTRAVENOUS | Status: DC

## 2013-02-24 NOTE — Patient Instructions (Addendum)
I found and removed 1 polyp that looks benign. Otherwise things were normal.  I will let you know pathology results and when to have another routine colonoscopy by mail.   I appreciate the opportunity to care for you. Gatha Mayer, MD, FACG  YOU HAD AN ENDOSCOPIC PROCEDURE TODAY AT [facility]: Refer to the procedure report that was given to you for any specific questions about what was found during the examination.  If the procedure report does not answer your questions, please call your gastroenterologist to clarify.  If you requested that your care partner not be given the details of your procedure findings, then the procedure report has been included in a sealed envelope for you to review at your convenience later.  YOU SHOULD EXPECT: Some feelings of bloating in the abdomen. Passage of more gas than usual.  Walking can help get rid of the air that was put into your GI tract during the procedure and reduce the bloating. If you had a lower endoscopy (such as a colonoscopy or flexible sigmoidoscopy) you may notice spotting of blood in your stool or on the toilet paper. If you underwent a bowel prep for your procedure, then you may not have a normal bowel movement for a few days.  DIET: Your first meal following the procedure should be a light meal and then it is ok to progress to your normal diet.  A half-sandwich or bowl of soup is an example of a good first meal.  Heavy or fried foods are harder to digest and may make you feel nauseous or bloated.  Likewise meals heavy in dairy and vegetables can cause extra gas to form and this can also increase the bloating.  Drink plenty of fluids but you should avoid alcoholic beverages for 24 hours.  ACTIVITY: Your care partner should take you home directly after the procedure.  You should plan to take it easy, moving slowly for the rest of the day.  You can resume normal activity the day after the procedure however you should NOT DRIVE or  use heavy machinery for 24 hours (because of the sedation medicines used during the test).    SYMPTOMS TO REPORT IMMEDIATELY: A gastroenterologist can be reached at any hour.  During normal business hours, 8:30 AM to 5:00 PM Monday through Friday, call [phone].  After hours and on weekends, please call the GI answering service at [phone] who will take a message and have the physician on call contact you.   Following lower endoscopy (colonoscopy or flexible sigmoidoscopy):  Excessive amounts of blood in the stool  Significant tenderness or worsening of abdominal pains  Swelling of the abdomen that is new, acute  Fever of 100F or higher  FOLLOW UP: If any biopsies were taken you will be contacted by phone or by letter within the next 1-3 weeks.  Call your gastroenterologist if you have not heard about the biopsies in 3 weeks.  Our staff will call the home number listed on your records the next business day following your procedure to check on you and address any questions or concerns that you may have at that time regarding the information given to you following your procedure. This is a courtesy call and so if there is no answer at the home number and we have not heard from you through the emergency physician on call, we will assume that you have returned to your regular daily activities without incident.  SIGNATURES/CONFIDENTIALITY: You and/or  your care partner have signed paperwork which will be entered into your electronic medical record.  These signatures attest to the fact that that the information above on your After Visit Summary has been reviewed and is understood.  Full responsibility of the confidentiality of this discharge information lies with you and/or your care-partner.

## 2013-02-24 NOTE — Progress Notes (Signed)
Report to pacu rn, vss, bbs=clear 

## 2013-02-24 NOTE — Progress Notes (Signed)
Called to room to assist during endoscopic procedure.  Patient ID and intended procedure confirmed with present staff. Received instructions for my participation in the procedure from the performing physician.  

## 2013-02-24 NOTE — Op Note (Signed)
[facility]  [address] [place], [id number]   COLONOSCOPY PROCEDURE REPORT  PATIENT: Charles [name], Charles [name]  MR#: [id number] BIRTHDATE: 03/30/1955 , 47  yrs. old GENDER: Male ENDOSCOPIST: Gatha Mayer, MD, [facility] REFERRED BY:   Glenford Bayley, DO PROCEDURE DATE:  02/24/2013 PROCEDURE:   Colonoscopy with snare polypectomy First Screening Colonoscopy - Avg.  risk and is 50 yrs.  old or older Yes.  Prior Negative Screening - Now for repeat screening. N/A  History of Adenoma - Now for follow-up colonoscopy & has been > or = to 3 yrs.  N/A  Polyps Removed Today? Yes. ASA CLASS:   Class II INDICATIONS:average risk screening. MEDICATIONS: propofol (Diprivan) 350mg  IV, MAC sedation, administered by CRNA, and These medications were titrated to patient response per physician's verbal order  DESCRIPTION OF PROCEDURE:   After the risks benefits and alternatives of the procedure were thoroughly explained, informed consent was obtained.  A digital rectal exam revealed no abnormalities of the rectum and a normal prostate..   The LB [id number]  endoscope was introduced through the anus and advanced to the cecum, which was identified by both the appendix and ileocecal valve. No adverse events experienced.   The quality of the prep was excellent using Suprep  The instrument was then slowly withdrawn as the colon was fully examined.   COLON FINDINGS: A single polyp measuring 5 mm in size was found in the sigmoid colon.  A polypectomy was performed with a cold snare. The resection was complete and the polyp tissue was completely retrieved.   The colon mucosa was otherwise normal.   A right colon retroflexion was performed.  Retroflexed views revealed no abnormalities. The time to cecum=4 minutes 12 seconds.  Withdrawal time=8 minutes 09 seconds.  The scope was withdrawn and the procedure completed. COMPLICATIONS: There were no complications.  ENDOSCOPIC IMPRESSION: 1.    Single polyp measuring 5 mm in size was found in the sigmoid colon; polypectomy was performed with a cold snare 2.   The colon mucosa was otherwise normal - excellent prep - first colonoscopy  RECOMMENDATIONS: Timing of repeat colonoscopy will be determined by pathology findings.   eSigned:  Gatha Mayer, MD, [facility] 02/24/2013 9:26 AM   cc: The Patient and Glenford Bayley, DO

## 2013-02-25 ENCOUNTER — Telehealth: Payer: Self-pay | Admitting: *Deleted

## 2013-02-25 NOTE — Telephone Encounter (Signed)
  Follow up Call-  Call back number 02/24/2013  Post procedure Call Back phone  # [phone]  Permission to leave phone message Yes     Patient questions:  Do you have a fever, pain , or abdominal swelling? no Pain Score  0 *  Have you tolerated food without any problems? yes  Have you been able to return to your normal activities? yes  Do you have any questions about your discharge instructions: Diet   no Medications  no Follow up visit  no  Do you have questions or concerns about your Care? no  Actions: * If pain score is 4 or above: No action needed, pain <4.

## 2013-03-01 ENCOUNTER — Encounter: Payer: Self-pay | Admitting: Internal Medicine

## 2013-03-01 NOTE — Progress Notes (Signed)
Quick Note:  5 mm tubular adenoma Repeat colonoscopy 2020 ______ 

## 2013-07-15 ENCOUNTER — Other Ambulatory Visit: Payer: Self-pay | Admitting: Physician Assistant

## 2013-08-16 ENCOUNTER — Ambulatory Visit (INDEPENDENT_AMBULATORY_CARE_PROVIDER_SITE_OTHER): Payer: BC Managed Care – PPO | Admitting: Family Medicine

## 2013-08-16 VITALS — BP 128/78 | HR 69 | Temp 97.5°F | Ht 68.75 in | Wt 217.2 lb

## 2013-08-16 DIAGNOSIS — J029 Acute pharyngitis, unspecified: Secondary | ICD-10-CM

## 2013-08-16 DIAGNOSIS — Z113 Encounter for screening for infections with a predominantly sexual mode of transmission: Secondary | ICD-10-CM

## 2013-08-16 MED ORDER — FIRST-DUKES MOUTHWASH MT SUSP: 5.0000 mL | OROMUCOSAL | Status: DC | PRN

## 2013-08-16 NOTE — Progress Notes (Signed)
   Subjective:    Patient ID: Charles [name], male    DOB: 12-08-1955, 58 y.o.   MRN: [id number]  HPI Patient has had sore throat for 2-3 days. Thinks he was exposed to gonorrhea 4-5 days ago. He has not been told this by his partner, but he looked on the internet and thinks this is consistent with his symptoms. Has had pain on his palate and thought he saw a single blister. He denies any sexual activity other than giving oral sex.   He is followed for his HIV in [place] and has an upcoming appointment. He reports his viral load and CD4 counts have been good. Last labs 4 months ago.   Review of Systems No fever/chills, fatigue today, no cough, no runny nose, no ear pain, no headache, no chest pain or SOB.    Objective:   Physical Exam  Vitals reviewed. Constitutional: He is oriented to person, place, and time. He appears well-developed and well-nourished.  HENT:  Head: Normocephalic and atraumatic.  Right Ear: Tympanic membrane, external ear and ear canal normal.  Left Ear: Tympanic membrane, external ear and ear canal normal.  Mouth/Throat: Mucous membranes are normal. No oropharyngeal exudate, posterior oropharyngeal edema, posterior oropharyngeal erythema or tonsillar abscesses.    Eyes: Conjunctivae are normal. Right eye exhibits no discharge. Left eye exhibits no discharge. No scleral icterus.  Neck: Normal range of motion. Neck supple.  Cardiovascular: Normal rate.   Pulmonary/Chest: Effort normal.  Musculoskeletal: Normal range of motion.  Neurological: He is alert and oriented to person, place, and time.  Skin: Skin is warm and dry.  Psychiatric: He has a normal mood and affect. His behavior is normal. Judgment and thought content normal.       Assessment & Plan:  1. Routine screening for STI (sexually transmitted infection) - RPR - N. gonorrhoeae, RNA - Chlamydia trachomatis, RNA  2. Acute pharyngitis, unspecified pharyngitis type -throat exam fairly  unremarkable, would be very early for gonorrhea - N. gonorrhoeae, RNA - Chlamydia trachomatis, RNA - Diphenhyd-Hydrocort-Nystatin (FIRST-DUKES MOUTHWASH) SUSP; Use as directed 5 mLs in the mouth or throat every 2 (two) hours as needed.  Dispense: 237 mL; Refill: 0 -Will call him with results -RTC if worsening symptoms, fever/chills  Elby Beck, FNP-BC  [facility], [facility]  08/16/2013 7:09 PM

## 2013-08-16 NOTE — Patient Instructions (Addendum)
Pharyngitis Pharyngitis is redness, pain, and swelling (inflammation) of your pharynx.  CAUSES  Pharyngitis is usually caused by infection. Most of the time, these infections are from viruses (viral) and are part of a cold. However, sometimes pharyngitis is caused by bacteria (bacterial). Pharyngitis can also be caused by allergies. Viral pharyngitis may be spread from person to person by coughing, sneezing, and personal items or utensils (cups, forks, spoons, toothbrushes). Bacterial pharyngitis may be spread from person to person by more intimate contact, such as kissing.  SIGNS AND SYMPTOMS  Symptoms of pharyngitis include:   Sore throat.   Tiredness (fatigue).   Low-grade fever.   Headache.  Joint pain and muscle aches.  Skin rashes.  Swollen lymph nodes.  Plaque-like film on throat or tonsils (often seen with bacterial pharyngitis). DIAGNOSIS  Your health care provider will ask you questions about your illness and your symptoms. Your medical history, along with a physical exam, is often all that is needed to diagnose pharyngitis. Sometimes, a rapid strep test is done. Other lab tests may also be done, depending on the suspected cause.  TREATMENT  Viral pharyngitis will usually get better in 3-4 days without the use of medicine. Bacterial pharyngitis is treated with medicines that kill germs (antibiotics).  HOME CARE INSTRUCTIONS   Drink enough water and fluids to keep your urine clear or pale yellow.   Only take over-the-counter or prescription medicines as directed by your health care provider:   If you are prescribed antibiotics, make sure you finish them even if you start to feel better.   Do not take aspirin.   Get lots of rest.   Gargle with 8 oz of salt water ( tsp of salt per 1 qt of water) as often as every 1-2 hours to soothe your throat.   Throat lozenges (if you are not at risk for choking) or sprays may be used to soothe your throat. SEEK MEDICAL  CARE IF:   You have large, tender lumps in your neck.  You have a rash.  You cough up green, yellow-brown, or bloody spit. SEEK IMMEDIATE MEDICAL CARE IF:   Your neck becomes stiff.  You drool or are unable to swallow liquids.  You vomit or are unable to keep medicines or liquids down.  You have severe pain that does not go away with the use of recommended medicines.  You have trouble breathing (not caused by a stuffy nose). MAKE SURE YOU:   Understand these instructions.  Will watch your condition.  Will get help right away if you are not doing well or get worse. Document Released: 01/27/2005 Document Revised: 11/17/2012 Document Reviewed: 10/04/2012 [facility] Patient Information 2015 [place], [place]. This information is not intended to replace advice given to you by your health care provider. Make sure you discuss any questions you have with your health care provider. Will

## 2013-08-17 LAB — RPR

## 2013-08-19 LAB — GONOCOCCUS CULTURE: Organism ID, Bacteria: NO GROWTH

## 2013-08-22 LAB — CHLAMYDIA CULTURE

## 2013-09-01 ENCOUNTER — Telehealth: Payer: Self-pay | Admitting: *Deleted

## 2013-09-01 NOTE — Telephone Encounter (Signed)
Message copied by Belva Chimes on Thu Sep 01, 2013  9:31 AM ------      Message from: Clarene Reamer B      Created: Thu Sep 01, 2013  9:12 AM      Regarding: lab results       Will you please check on the status of his culture? It seems like we should have a final result by now.      Thanks,      Debbie ------

## 2013-09-02 NOTE — Telephone Encounter (Signed)
Everything is back

## 2014-09-22 ENCOUNTER — Ambulatory Visit (INDEPENDENT_AMBULATORY_CARE_PROVIDER_SITE_OTHER): Payer: 59 | Admitting: Physician Assistant

## 2014-09-22 VITALS — BP 122/78 | HR 102 | Temp 98.7°F | Resp 16 | Ht 68.75 in | Wt 218.8 lb

## 2014-09-22 DIAGNOSIS — R05 Cough: Secondary | ICD-10-CM | POA: Diagnosis not present

## 2014-09-22 DIAGNOSIS — Z9109 Other allergy status, other than to drugs and biological substances: Secondary | ICD-10-CM

## 2014-09-22 DIAGNOSIS — R6889 Other general symptoms and signs: Secondary | ICD-10-CM

## 2014-09-22 DIAGNOSIS — R058 Other specified cough: Secondary | ICD-10-CM

## 2014-09-22 DIAGNOSIS — R059 Cough, unspecified: Secondary | ICD-10-CM

## 2014-09-22 DIAGNOSIS — Z889 Allergy status to unspecified drugs, medicaments and biological substances status: Secondary | ICD-10-CM

## 2014-09-22 LAB — POCT CBC
GRANULOCYTE PERCENT: 56.6 % (ref 37–80)
HEMATOCRIT: 50.9 % (ref 43.5–53.7)
Hemoglobin: 16.4 g/dL (ref 14.1–18.1)
Lymph, poc: 1.7 (ref 0.6–3.4)
MCH, POC: 30 pg (ref 27–31.2)
MCHC: 32.3 g/dL (ref 31.8–35.4)
MCV: 92.9 fL (ref 80–97)
MID (cbc): 0.4 (ref 0–0.9)
MPV: 5.8 fL (ref 0–99.8)
POC Granulocyte: 2.8 (ref 2–6.9)
POC LYMPH PERCENT: 34.5 %L (ref 10–50)
POC MID %: 8.9 %M (ref 0–12)
Platelet Count, POC: 191 10*3/uL (ref 142–424)
RBC: 5.49 M/uL (ref 4.69–6.13)
RDW, POC: 12.7 %
WBC: 4.9 10*3/uL (ref 4.6–10.2)

## 2014-09-22 MED ORDER — CETIRIZINE HCL 10 MG PO TABS: 10.0000 mg | ORAL_TABLET | Freq: Every day | ORAL | Status: DC

## 2014-09-22 MED ORDER — GUAIFENESIN ER 1200 MG PO TB12: 1.0000 | ORAL_TABLET | Freq: Two times a day (BID) | ORAL | Status: DC | PRN

## 2014-09-22 MED ORDER — AZITHROMYCIN 250 MG PO TABS: ORAL_TABLET | ORAL | Status: AC

## 2014-09-22 MED ORDER — BENZONATATE 100 MG PO CAPS: 100.0000 mg | ORAL_CAPSULE | Freq: Three times a day (TID) | ORAL | Status: DC | PRN

## 2014-09-22 NOTE — Progress Notes (Signed)
[facility] [address], [place] [address] [phone]  Date:  09/22/2014   Name:  Charles [name]   DOB:  14-May-1955   MRN:  [id number]  PCP:  Kennon Portela, MD   Chief Complaint  Patient presents with  . Cough    x sat.  Marland Kitchen Hoarse    x sat.   . Dizziness    x sat.   . Sore Throat    x sat.      History of Present Illness:  Charles [name] is a 59 y.o. male patient who presents to [facility] for chief complaint of hoarseness, sorethroat, and cough for 6 days.  His cough is non-productive.  He had sore throat that appears to be resolving.  The cough worsens at night making it difficult to sleep.  He has some sneezing.  He had one incident of dizziness during a coughing fit.  He has no fever, dyspnea, or sob.  He has had some bodyaches which resolved.  He has some congestion.  He has been traveling via plane, [place], but no known sick contacts.  He feels as if his sxs are resolving.      Patient Active Problem List   Diagnosis Date Noted  . Personal history of colonic adenoma 02/24/2013  . HIV (human immunodeficiency virus infection) 05/01/2011    Past Medical History  Diagnosis Date  . HIV positive   . Arthritis   . Personal history of colonic adenoma 02/24/2013    02/24/2013 diminutive sigmoid polyp - tubular adenoma - repeat colon 2020      Past Surgical History  Procedure Laterality Date  . Prostate biopsy      Social History  Substance Use Topics  . Smoking status: Never Smoker   . Smokeless tobacco: Never Used  . Alcohol Use: No    Family History  Problem Relation Age of Onset  . Pancreatic cancer Mother   . Bladder Cancer Father   . Colon cancer Neg Hx   . Esophageal cancer Neg Hx   . Stomach cancer Neg Hx   . Rectal cancer Neg Hx     No Known Allergies  Medication list has been reviewed and updated.  Current Outpatient Prescriptions on File Prior to Visit  Medication Sig Dispense Refill  . Multiple Vitamin  (MULTIVITAMIN) tablet Take 1 tablet by mouth daily.    Marland Kitchen abacavir-lamiVUDine (EPZICOM) 600-300 MG per tablet Take 1 tablet by mouth daily. 300mg  daily    . atazanavir (REYATAZ) 300 MG capsule Take 300 mg by mouth daily with breakfast.    . Diphenhyd-Hydrocort-Nystatin (FIRST-DUKES MOUTHWASH) SUSP Use as directed 5 mLs in the mouth or throat every 2 (two) hours as needed. (Patient not taking: Reported on 09/22/2014) 237 mL 0  . ritonavir (NORVIR) 100 MG capsule Take by mouth daily with breakfast.    . valACYclovir (VALTREX) 1000 MG tablet TAKE 1 TABLET (1,000 MG TOTAL) BY MOUTH 2 (TWO) TIMES DAILY. (Patient not taking: Reported on 09/22/2014) 20 tablet 2   No current facility-administered medications on file prior to visit.    ROS ROS otherwise unremarkable unless listed above.    Physical Examination: BP 122/78 mmHg  Pulse 102  Temp(Src) 98.7 F (37.1 C) (Oral)  Resp 16  Ht 5' 8.75" (1.746 m)  Wt 218 lb 12.8 oz (99.247 kg)  BMI 32.56 kg/m2  SpO2 98% Ideal Body Weight: Weight in (lb) to have BMI = 25: 167.7  Physical Exam  Constitutional: He appears well-developed and well-nourished.  HENT:  Head: Normocephalic and atraumatic.  Right Ear: Tympanic membrane, external ear and ear canal normal.  Left Ear: Tympanic membrane, external ear and ear canal normal.  Nose: Rhinorrhea present. No mucosal edema.  Mouth/Throat: No uvula swelling. No oropharyngeal exudate, posterior oropharyngeal edema or posterior oropharyngeal erythema.  Oropharynx with thick mucus present posteriorly.    Eyes: EOM are normal. Pupils are equal, round, and reactive to light. Right eye exhibits no discharge. Left eye exhibits no discharge.  Cardiovascular: Normal rate, regular rhythm and intact distal pulses.  Exam reveals no friction rub.   No murmur heard. Pulmonary/Chest: Effort normal and breath sounds normal. No respiratory distress. He has no wheezes.  Skin: He is not diaphoretic.  Psychiatric: He has a  normal mood and affect. His behavior is normal.    Results for orders placed or performed in visit on 09/22/14  POCT CBC  Result Value Ref Range   WBC 4.9 4.6 - 10.2 K/uL   Lymph, poc 1.7 0.6 - 3.4   POC LYMPH PERCENT 34.5 10 - 50 %L   MID (cbc) 0.4 0 - 0.9   POC MID % 8.9 0 - 12 %M   POC Granulocyte 2.8 2 - 6.9   Granulocyte percent 56.6 37 - 80 %G   RBC 5.49 4.69 - 6.13 M/uL   Hemoglobin 16.4 14.1 - 18.1 g/dL   HCT, POC 50.9 43.5 - 53.7 %   MCV 92.9 80 - 97 fL   MCH, POC 30.0 27 - 31.2 pg   MCHC 32.3 31.8 - 35.4 g/dL   RDW, POC 12.7 %   Platelet Count, POC 191 142 - 424 K/uL   MPV 5.8 0 - 99.8 fL   Assessment and Plan: 59 year old male is here today with symptoms of cough, sorethroat, hoarseness, and event of dizziness.  His symptoms appear possible allergies.  Advised supportively, but if his sxs continue, in 3 days, will start the azithromycin.  Advised to take to completion.    Multiple allergies - Plan: cetirizine (ZYRTEC) 10 MG tablet  Congestion of throat - Plan: Guaifenesin (MUCINEX MAXIMUM STRENGTH) 1200 MG TB12, azithromycin (ZITHROMAX) 250 MG tablet, POCT CBC  Cough - Plan: benzonatate (TESSALON) 100 MG capsule, azithromycin (ZITHROMAX) 250 MG tablet, POCT CBC  Productive cough - Plan: POCT CBC  Ivar Drape, PA-C [facility] 8/18/20166:14 PM

## 2014-09-22 NOTE — Patient Instructions (Signed)
Please increase your water intake to 64oz per day (a little less than 4 regular sized water bottles). Please fill the antibiotic if you are not having improvement in the next 2 days.  Take to completion. Please let me know if your dizziness continues.

## 2014-09-28 ENCOUNTER — Encounter: Payer: Self-pay | Admitting: Physician Assistant

## 2014-09-28 ENCOUNTER — Other Ambulatory Visit: Payer: Self-pay | Admitting: Family Medicine

## 2014-12-27 ENCOUNTER — Other Ambulatory Visit: Payer: Self-pay | Admitting: Family Medicine

## 2014-12-27 ENCOUNTER — Ambulatory Visit (INDEPENDENT_AMBULATORY_CARE_PROVIDER_SITE_OTHER): Payer: 59 | Admitting: Family Medicine

## 2014-12-27 VITALS — BP 114/78 | HR 65 | Temp 98.2°F | Resp 18 | Wt 233.4 lb

## 2014-12-27 DIAGNOSIS — B001 Herpesviral vesicular dermatitis: Secondary | ICD-10-CM

## 2014-12-27 DIAGNOSIS — L710 Perioral dermatitis: Secondary | ICD-10-CM

## 2014-12-27 MED ORDER — VALACYCLOVIR HCL 1 G PO TABS: 1000.0000 mg | ORAL_TABLET | Freq: Two times a day (BID) | ORAL | Status: DC

## 2014-12-27 MED ORDER — CLOTRIMAZOLE-BETAMETHASONE 1-0.05 % EX CREA: 1.0000 "application " | TOPICAL_CREAM | Freq: Two times a day (BID) | CUTANEOUS | Status: DC

## 2014-12-27 NOTE — Patient Instructions (Signed)
Let us know if you're not improving

## 2014-12-27 NOTE — Progress Notes (Signed)
@[facility] @  This chart was scribed for Charles Haber, MD by Thea Alken, ED Scribe. This patient was seen in room 14 and the patient's care was started at 7:29 PM.  Patient ID: [name] Charles MRN: [id number], DOB: 26-Feb-1955, 59 y.o. Date of Encounter: 12/27/2014, 7:29 PM  Primary Physician: Kennon Portela, MD  Chief Complaint:  Chief Complaint  Patient presents with  . Medication Refill    valtrex 1000mg     HPI: 59 y.o. year old male with history below presents for a medication refill. He would like to restart Valtrex. He takes medication about 2-3 times a year.  He also has a scaly rash to lower lip for a lower that has spread to the corner of his mouth.  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   Past Medical History  Diagnosis Date  . HIV positive ([place])   . Arthritis   . Personal history of colonic adenoma 02/24/2013    02/24/2013 diminutive sigmoid polyp - tubular adenoma - repeat colon 2020       Home Meds: Prior to Admission medications   Medication Sig Start Date End Date Taking? Authorizing Provider  Abacavir-Dolutegravir-Lamivud (TRIUMEQ) [id number] MG TABS Take by mouth.   Yes Historical Provider, MD  Multiple Vitamin (MULTIVITAMIN) tablet Take 1 tablet by mouth daily.   Yes Historical Provider, MD  valACYclovir (VALTREX) 1000 MG tablet TAKE 1 TABLET BY MOUTH TWICE A DAY 09/28/14  Yes Thao P Le, DO  abacavir-lamiVUDine (EPZICOM) 600-300 MG per tablet Take 1 tablet by mouth daily. 300mg  daily    Historical Provider, MD  atazanavir (REYATAZ) 300 MG capsule Take 300 mg by mouth daily with breakfast.    Historical Provider, MD  benzonatate (TESSALON) 100 MG capsule Take 1-2 capsules (100-200 mg total) by mouth 3 (three) times daily as needed for cough. Patient not taking: Reported on 12/27/2014 09/22/14   Dorian Heckle English, PA  cetirizine (ZYRTEC) 10 MG tablet Take 1 tablet (10 mg total) by mouth daily. Patient not taking: Reported on 12/27/2014 09/22/14    Dorian Heckle English, PA  Diphenhyd-Hydrocort-Nystatin (FIRST-DUKES MOUTHWASH) SUSP Use as directed 5 mLs in the mouth or throat every 2 (two) hours as needed. Patient not taking: Reported on 09/22/2014 08/16/13   Elby Beck, FNP  Guaifenesin [facility] MAXIMUM STRENGTH) 1200 MG TB12 Take 1 tablet (1,200 mg total) by mouth every 12 (twelve) hours as needed. Patient not taking: Reported on 12/27/2014 09/22/14   Dorian Heckle English, PA  ritonavir (NORVIR) 100 MG capsule Take by mouth daily with breakfast.    Historical Provider, MD    Allergies: No Known Allergies  Social History   Social History  . Marital Status: Married    Spouse Name: N/A  . Number of Children: N/A  . Years of Education: N/A   Occupational History  . Not on file.   Social History Main Topics  . Smoking status: Never Smoker   . Smokeless tobacco: Never Used  . Alcohol Use: No  . Drug Use: No  . Sexual Activity: Not on file   Other Topics Concern  . Not on file   Social History Narrative     Review of Systems: Constitutional: negative for chills, fever, night sweats, weight changes, or fatigue  HEENT: negative for vision changes, hearing loss, congestion, rhinorrhea, ST, epistaxis, or sinus pressure Cardiovascular: negative for chest pain or palpitations Respiratory: negative for hemoptysis, wheezing, shortness of breath, or cough Abdominal: negative for abdominal pain, nausea, vomiting, diarrhea, or  constipation Dermatological: Positive for rash Neurologic: negative for headache, dizziness, or syncope All other systems reviewed and are otherwise negative with the exception to those above and in the HPI.   Physical Exam: Blood pressure 114/78, pulse 65, temperature 98.2 F (36.8 C), temperature source Oral, resp. rate 18, weight 233 lb 6.4 oz (105.87 kg), SpO2 97 %., Body mass index is 34.73 kg/(m^2). General: Well developed, well nourished, in no acute distress. Head: Normocephalic, atraumatic, eyes  without discharge, sclera non-icteric, nares are without discharge. Bilateral auditory canals clear, TM's are without perforation, pearly grey and translucent with reflective cone of light bilaterally. Oral cavity moist, posterior pharynx without exudate, erythema, peritonsillar abscess, or post nasal drip.  Neck: Supple. No thyromegaly. Full ROM. No lymphadenopathy. Extremities/Skin: Warm and dry. No clubbing or cyanosis. No edema. No rashes or suspicious lesions. Patient has a cold sore in the right lower lip. He also has diffuse erythema from I am border all the way around his lips some scaling. Neuro: Alert and oriented X 3. Moves all extremities spontaneously. Gait is normal. CNII-XII grossly in tact. Psych:  Responds to questions appropriately with a normal affect.    ASSESSMENT AND PLAN:  59 y.o. year old male with  This chart was scribed in my presence and reviewed by me personally.    ICD-9-CM ICD-10-CM   1. Cold sore 054.9 B00.1 valACYclovir (VALTREX) 1000 MG tablet  2. Perioral dermatitis 695.3 L71.0 clotrimazole-betamethasone (LOTRISONE) cream     Signed, Charles Haber, MD   By signing my name below, I, Charles [name], attest that this documentation has been prepared under the direction and in the presence of Charles Haber, MD.  Electronically Signed: Thea Alken, ED Scribe. 12/27/2014. 7:34 PM.  Signed, Charles Haber, MD 12/27/2014 7:29 PM

## 2015-01-09 ENCOUNTER — Ambulatory Visit (INDEPENDENT_AMBULATORY_CARE_PROVIDER_SITE_OTHER): Payer: 59 | Admitting: Family Medicine

## 2015-01-09 VITALS — BP 120/80 | HR 73 | Temp 97.8°F | Resp 18 | Ht 68.75 in | Wt 222.0 lb

## 2015-01-09 DIAGNOSIS — J019 Acute sinusitis, unspecified: Secondary | ICD-10-CM | POA: Diagnosis not present

## 2015-01-09 DIAGNOSIS — R42 Dizziness and giddiness: Secondary | ICD-10-CM

## 2015-01-09 DIAGNOSIS — R059 Cough, unspecified: Secondary | ICD-10-CM

## 2015-01-09 DIAGNOSIS — R05 Cough: Secondary | ICD-10-CM

## 2015-01-09 MED ORDER — BENZONATATE 100 MG PO CAPS: 100.0000 mg | ORAL_CAPSULE | Freq: Three times a day (TID) | ORAL | Status: DC | PRN

## 2015-01-09 MED ORDER — AMOXICILLIN-POT CLAVULANATE 875-125 MG PO TABS
1.0000 | ORAL_TABLET | Freq: Two times a day (BID) | ORAL | Status: DC
Start: 2015-01-09 — End: 2015-03-16

## 2015-01-09 NOTE — Progress Notes (Signed)
Subjective:  This chart was scribed for Merri Ray, MD by Leandra Kern, Medical Scribe. This patient was seen in Room 14 and the patient's care was started at 3:05 PM.   Patient ID: Charles [name], male    DOB: 05/07/1955, 59 y.o.   MRN: [id number]  Chief Complaint  Patient presents with  . Cough    Onset 2 weeks  . Fever    1 week ago  . Dizziness    Onset yesterday    HPI HPI Comments: Charles [name] is a 59 y.o. male who presents to [facility] complaining of cough, starting onset 2 weeks ago.  Pt was seen about 2 weeks ago for unrelated issue. Pt reports that within a 2 days of that visit, he started experiencing productive cough, these symptoms began to improve before it started to worsen within the past week. He indicates that 1 week ago, he experienced fever that resolved the same night. Pt's cough symptoms were worsening as  he presented with yellow-green productive cough about 2 days ago that has associated dizziness with persistent cough episodes, head pressure on his temples area, congestion, dry mouth, and mild rhinorrhea. Pt states that he has been using Mucinex daily. Pt takes Claritin and zyrtec for his allergies. Pt denies heart palpitations, shortness of breath, chest pain, pressure behind the eyes, or fever within the past 6 days.   Pt has a hx of HIV, and he is compliant with taking Triumeq. His most recent labs showed 700 cd4 count, undetectable.   Pt works as a IT consultant.    Patient Active Problem List   Diagnosis Date Noted  . Personal history of colonic adenoma 02/24/2013  . HIV (human immunodeficiency virus infection) ([place]) 05/01/2011   Past Medical History  Diagnosis Date  . HIV positive ([place])   . Arthritis   . Personal history of colonic adenoma 02/24/2013    02/24/2013 diminutive sigmoid polyp - tubular adenoma - repeat colon 2020     Past Surgical History  Procedure Laterality Date  . Prostate biopsy     No  Known Allergies Prior to Admission medications   Medication Sig Start Date End Date Taking? Authorizing Provider  Abacavir-Dolutegravir-Lamivud (TRIUMEQ) [id number] MG TABS Take by mouth.   Yes Historical Provider, MD  benzonatate (TESSALON) 100 MG capsule Take 1-2 capsules (100-200 mg total) by mouth 3 (three) times daily as needed for cough. 09/22/14  Yes Charles Heckle English, PA  clotrimazole-betamethasone (LOTRISONE) cream Apply 1 application topically 2 (two) times daily. 12/27/14  Yes Robyn Haber, MD  Guaifenesin [facility] MAXIMUM STRENGTH) 1200 MG TB12 Take 1 tablet (1,200 mg total) by mouth every 12 (twelve) hours as needed. 09/22/14  Yes Charles Heckle English, PA  Multiple Vitamin (MULTIVITAMIN) tablet Take 1 tablet by mouth daily.   Yes Historical Provider, MD  valACYclovir (VALTREX) 1000 MG tablet Take 1 tablet (1,000 mg total) by mouth 2 (two) times daily. 12/27/14  Yes Robyn Haber, MD   Social History   Social History  . Marital Status: Married    Spouse Name: N/A  . Number of Children: N/A  . Years of Education: N/A   Occupational History  . Not on file.   Social History Main Topics  . Smoking status: Never Smoker   . Smokeless tobacco: Never Used  . Alcohol Use: No  . Drug Use: No  . Sexual Activity: Not on file   Other Topics Concern  . Not on file  Social History Narrative    Review of Systems  Constitutional: Negative for fever.  HENT: Positive for congestion and rhinorrhea.   Respiratory: Positive for cough. Negative for shortness of breath.   Cardiovascular: Negative for chest pain and palpitations.  Neurological: Positive for dizziness and headaches.      Objective:   Physical Exam  Constitutional: He is oriented to person, place, and time. He appears well-developed and well-nourished. No distress.  HENT:  Head: Normocephalic and atraumatic.  Right Ear: Tympanic membrane, external ear and ear canal normal.  Left Ear: Tympanic membrane, external  ear and ear canal normal.  Nose: No rhinorrhea.  Mouth/Throat: Oropharynx is clear and moist and mucous membranes are normal. No oropharyngeal exudate or posterior oropharyngeal erythema.  Frontal and maximal sinuses are non tender.  No lymphadenopathy.   Eyes: Conjunctivae and EOM are normal. Pupils are equal, round, and reactive to light.  Neck: Neck supple.  Cardiovascular: Normal rate, regular rhythm, normal heart sounds and intact distal pulses.   No murmur heard. Pulmonary/Chest: Effort normal and breath sounds normal. He has no wheezes. He has no rhonchi. He has no rales.  Abdominal: Soft. There is no tenderness.  Lymphadenopathy:    He has no cervical adenopathy.  Neurological: He is alert and oriented to person, place, and time. No cranial nerve deficit.  Skin: Skin is warm and dry. No rash noted.  Psychiatric: He has a normal mood and affect. His behavior is normal.  Nursing note and vitals reviewed.   Filed Vitals:   01/09/15 1400 01/09/15 1405  BP: 130/94 120/80  Pulse: 73   Temp: 97.8 F (36.6 C)   TempSrc: Oral   Resp: 18   Height: 5' 8.75" (1.746 m)   Weight: 222 lb (100.699 kg)   SpO2: 98%       Assessment & Plan:   Charles [name] is a 59 y.o. male Acute sinusitis, recurrence not specified, unspecified location - Plan: amoxicillin-clavulanate (AUGMENTIN) 875-125 MG tablet  Dizziness  Cough - Plan: benzonatate (TESSALON) 100 MG capsule  Suspected secondary sinusitis, ethmoid versus sphenoid possible as no focal tenderness on exam. This along with possible decreased fluids today may be cause of his dizziness. Nonfocal exam in office, reassuring vital signs.  -Start Augmentin 875 mg twice a day, saline nasal spray.  -Okay to continue [employer] as needed for cough. Refilled  -RTC precautions discussed if persistent dizziness, persistent face pain/headache, fevers/ chills, shortness of breath or other worsening.  Understanding expressed.  Meds ordered  this encounter  Medications  . amoxicillin-clavulanate (AUGMENTIN) 875-125 MG tablet    Sig: Take 1 tablet by mouth 2 (two) times daily.    Dispense:  20 tablet    Refill:  0  . benzonatate (TESSALON) 100 MG capsule    Sig: Take 1-2 capsules (100-200 mg total) by mouth 3 (three) times daily as needed for cough.    Dispense:  40 capsule    Refill:  0   Patient Instructions  Start antibiotic, Saline nasal spray atleast 4 times per day, over the counter mucinex or tessalon if needed for cough, drink plenty of fluids. If dizziness not improving with increased fluids and antibiotic, or any worsening such as fever or shortness of breath - return for recheck.   Return to the clinic or go to the nearest emergency room if any of your symptoms worsen or new symptoms occur.  Sinusitis, Adult Sinusitis is redness, soreness, and inflammation of the paranasal sinuses. Paranasal sinuses  are air pockets within the bones of your face. They are located beneath your eyes, in the middle of your forehead, and above your eyes. In healthy paranasal sinuses, mucus is able to drain out, and air is able to circulate through them by way of your nose. However, when your paranasal sinuses are inflamed, mucus and air can become trapped. This can allow bacteria and other germs to grow and cause infection. Sinusitis can develop quickly and last only a short time (acute) or continue over a long period (chronic). Sinusitis that lasts for more than 12 weeks is considered chronic. CAUSES Causes of sinusitis include:  Allergies.  Structural abnormalities, such as displacement of the cartilage that separates your nostrils (deviated septum), which can decrease the air flow through your nose and sinuses and affect sinus drainage.  Functional abnormalities, such as when the small hairs (cilia) that line your sinuses and help remove mucus do not work properly or are not present. SIGNS AND SYMPTOMS Symptoms of acute and chronic  sinusitis are the same. The primary symptoms are pain and pressure around the affected sinuses. Other symptoms include:  Upper toothache.  Earache.  Headache.  Bad breath.  Decreased sense of smell and taste.  A cough, which worsens when you are lying flat.  Fatigue.  Fever.  Thick drainage from your nose, which often is green and may contain pus (purulent).  Swelling and warmth over the affected sinuses. DIAGNOSIS Your health care provider will perform a physical exam. During your exam, your health care provider may perform any of the following to help determine if you have acute sinusitis or chronic sinusitis:  Look in your nose for signs of abnormal growths in your nostrils (nasal polyps).  Tap over the affected sinus to check for signs of infection.  View the inside of your sinuses using an imaging device that has a light attached (endoscope). If your health care provider suspects that you have chronic sinusitis, one or more of the following tests may be recommended:  Allergy tests.  Nasal culture. A sample of mucus is taken from your nose, sent to a lab, and screened for bacteria.  Nasal cytology. A sample of mucus is taken from your nose and examined by your health care provider to determine if your sinusitis is related to an allergy. TREATMENT Most cases of acute sinusitis are related to a viral infection and will resolve on their own within 10 days. Sometimes, medicines are prescribed to help relieve symptoms of both acute and chronic sinusitis. These may include pain medicines, decongestants, nasal steroid sprays, or saline sprays. However, for sinusitis related to a bacterial infection, your health care provider will prescribe antibiotic medicines. These are medicines that will help kill the bacteria causing the infection. Rarely, sinusitis is caused by a fungal infection. In these cases, your health care provider will prescribe antifungal medicine. For some cases of  chronic sinusitis, surgery is needed. Generally, these are cases in which sinusitis recurs more than 3 times per year, despite other treatments. HOME CARE INSTRUCTIONS  Drink plenty of water. Water helps thin the mucus so your sinuses can drain more easily.  Use a humidifier.  Inhale steam 3-4 times a day (for example, sit in the bathroom with the shower running).  Apply a warm, moist washcloth to your face 3-4 times a day, or as directed by your health care provider.  Use saline nasal sprays to help moisten and clean your sinuses.  Take medicines only as directed by your  health care provider.  If you were prescribed either an antibiotic or antifungal medicine, finish it all even if you start to feel better. SEEK IMMEDIATE MEDICAL CARE IF:  You have increasing pain or severe headaches.  You have nausea, vomiting, or drowsiness.  You have swelling around your face.  You have vision problems.  You have a stiff neck.  You have difficulty breathing.   This information is not intended to replace advice given to you by your health care provider. Make sure you discuss any questions you have with your health care provider.   Document Released: 01/27/2005 Document Revised: 02/17/2014 Document Reviewed: 02/11/2011 Elsevier Interactive Patient Education 2016 Elsevier Inc.   Dizziness Dizziness is a common problem. It is a feeling of unsteadiness or light-headedness. You may feel like you are about to faint. Dizziness can lead to injury if you stumble or fall. Anyone can become dizzy, but dizziness is more common in older adults. This condition can be caused by a number of things, including medicines, dehydration, or illness. HOME CARE INSTRUCTIONS Taking these steps may help with your condition: Eating and Drinking  Drink enough fluid to keep your urine clear or pale yellow. This helps to keep you from becoming dehydrated. Try to drink more clear fluids, such as water.  Do not drink  alcohol.  Limit your caffeine intake if directed by your health care provider.  Limit your salt intake if directed by your health care provider. Activity  Avoid making quick movements.  Rise slowly from chairs and steady yourself until you feel okay.  In the morning, first sit up on the side of the bed. When you feel okay, stand slowly while you hold onto something until you know that your balance is fine.  Move your legs often if you need to stand in one place for a long time. Tighten and relax your muscles in your legs while you are standing.  Do not drive or operate heavy machinery if you feel dizzy.  Avoid bending down if you feel dizzy. Place items in your home so that they are easy for you to reach without leaning over. Lifestyle  Do not use any tobacco products, including cigarettes, chewing tobacco, or electronic cigarettes. If you need help quitting, ask your health care provider.  Try to reduce your stress level, such as with yoga or meditation. Talk with your health care provider if you need help. General Instructions  Watch your dizziness for any changes.  Take medicines only as directed by your health care provider. Talk with your health care provider if you think that your dizziness is caused by a medicine that you are taking.  Tell a friend or a family member that you are feeling dizzy. If he or she notices any changes in your behavior, have this person call your health care provider.  Keep all follow-up visits as directed by your health care provider. This is important. SEEK MEDICAL CARE IF:  Your dizziness does not go away.  Your dizziness or light-headedness gets worse.  You feel nauseous.  You have reduced hearing.  You have new symptoms.  You are unsteady on your feet or you feel like the room is spinning. SEEK IMMEDIATE MEDICAL CARE IF:  You vomit or have diarrhea and are unable to eat or drink anything.  You have problems talking, walking,  swallowing, or using your arms, hands, or legs.  You feel generally weak.  You are not thinking clearly or you have trouble forming sentences.  It may take a friend or family member to notice this.  You have chest pain, abdominal pain, shortness of breath, or sweating.  Your vision changes.  You notice any bleeding.  You have a headache.  You have neck pain or a stiff neck.  You have a fever.   This information is not intended to replace advice given to you by your health care provider. Make sure you discuss any questions you have with your health care provider.   Document Released: 07/23/2000 Document Revised: 06/13/2014 Document Reviewed: 01/23/2014 Elsevier Interactive Patient Education [employer].        By signing my name below, I, Rawaa Al Rifaie, attest that this documentation has been prepared under the direction and in the presence of Merri Ray, MD.  Royann Shivers Rifaie, Medical Scribe. 01/09/2015.  3:26 PM.  I personally performed the services described in this documentation, which was scribed in my presence. The recorded information has been reviewed and considered, and addended by me as needed.

## 2015-01-09 NOTE — Patient Instructions (Addendum)
Start antibiotic, Saline nasal spray atleast 4 times per day, over the counter mucinex or tessalon if needed for cough, drink plenty of fluids. If dizziness not improving with increased fluids and antibiotic, or any worsening such as fever or shortness of breath - return for recheck.   Return to the clinic or go to the nearest emergency room if any of your symptoms worsen or new symptoms occur.  Sinusitis, Adult Sinusitis is redness, soreness, and inflammation of the paranasal sinuses. Paranasal sinuses are air pockets within the bones of your face. They are located beneath your eyes, in the middle of your forehead, and above your eyes. In healthy paranasal sinuses, mucus is able to drain out, and air is able to circulate through them by way of your nose. However, when your paranasal sinuses are inflamed, mucus and air can become trapped. This can allow bacteria and other germs to grow and cause infection. Sinusitis can develop quickly and last only a short time (acute) or continue over a long period (chronic). Sinusitis that lasts for more than 12 weeks is considered chronic. CAUSES Causes of sinusitis include:  Allergies.  Structural abnormalities, such as displacement of the cartilage that separates your nostrils (deviated septum), which can decrease the air flow through your nose and sinuses and affect sinus drainage.  Functional abnormalities, such as when the small hairs (cilia) that line your sinuses and help remove mucus do not work properly or are not present. SIGNS AND SYMPTOMS Symptoms of acute and chronic sinusitis are the same. The primary symptoms are pain and pressure around the affected sinuses. Other symptoms include:  Upper toothache.  Earache.  Headache.  Bad breath.  Decreased sense of smell and taste.  A cough, which worsens when you are lying flat.  Fatigue.  Fever.  Thick drainage from your nose, which often is green and may contain pus (purulent).  Swelling  and warmth over the affected sinuses. DIAGNOSIS Your health care provider will perform a physical exam. During your exam, your health care provider may perform any of the following to help determine if you have acute sinusitis or chronic sinusitis:  Look in your nose for signs of abnormal growths in your nostrils (nasal polyps).  Tap over the affected sinus to check for signs of infection.  View the inside of your sinuses using an imaging device that has a light attached (endoscope). If your health care provider suspects that you have chronic sinusitis, one or more of the following tests may be recommended:  Allergy tests.  Nasal culture. A sample of mucus is taken from your nose, sent to a lab, and screened for bacteria.  Nasal cytology. A sample of mucus is taken from your nose and examined by your health care provider to determine if your sinusitis is related to an allergy. TREATMENT Most cases of acute sinusitis are related to a viral infection and will resolve on their own within 10 days. Sometimes, medicines are prescribed to help relieve symptoms of both acute and chronic sinusitis. These may include pain medicines, decongestants, nasal steroid sprays, or saline sprays. However, for sinusitis related to a bacterial infection, your health care provider will prescribe antibiotic medicines. These are medicines that will help kill the bacteria causing the infection. Rarely, sinusitis is caused by a fungal infection. In these cases, your health care provider will prescribe antifungal medicine. For some cases of chronic sinusitis, surgery is needed. Generally, these are cases in which sinusitis recurs more than 3 times per year, despite  other treatments. HOME CARE INSTRUCTIONS  Drink plenty of water. Water helps thin the mucus so your sinuses can drain more easily.  Use a humidifier.  Inhale steam 3-4 times a day (for example, sit in the bathroom with the shower running).  Apply a warm,  moist washcloth to your face 3-4 times a day, or as directed by your health care provider.  Use saline nasal sprays to help moisten and clean your sinuses.  Take medicines only as directed by your health care provider.  If you were prescribed either an antibiotic or antifungal medicine, finish it all even if you start to feel better. SEEK IMMEDIATE MEDICAL CARE IF:  You have increasing pain or severe headaches.  You have nausea, vomiting, or drowsiness.  You have swelling around your face.  You have vision problems.  You have a stiff neck.  You have difficulty breathing.   This information is not intended to replace advice given to you by your health care provider. Make sure you discuss any questions you have with your health care provider.   Document Released: 01/27/2005 Document Revised: 02/17/2014 Document Reviewed: 02/11/2011 Elsevier Interactive Patient Education 2016 Elsevier Inc.   Dizziness Dizziness is a common problem. It is a feeling of unsteadiness or light-headedness. You may feel like you are about to faint. Dizziness can lead to injury if you stumble or fall. Anyone can become dizzy, but dizziness is more common in older adults. This condition can be caused by a number of things, including medicines, dehydration, or illness. HOME CARE INSTRUCTIONS Taking these steps may help with your condition: Eating and Drinking  Drink enough fluid to keep your urine clear or pale yellow. This helps to keep you from becoming dehydrated. Try to drink more clear fluids, such as water.  Do not drink alcohol.  Limit your caffeine intake if directed by your health care provider.  Limit your salt intake if directed by your health care provider. Activity  Avoid making quick movements.  Rise slowly from chairs and steady yourself until you feel okay.  In the morning, first sit up on the side of the bed. When you feel okay, stand slowly while you hold onto something until you  know that your balance is fine.  Move your legs often if you need to stand in one place for a long time. Tighten and relax your muscles in your legs while you are standing.  Do not drive or operate heavy machinery if you feel dizzy.  Avoid bending down if you feel dizzy. Place items in your home so that they are easy for you to reach without leaning over. Lifestyle  Do not use any tobacco products, including cigarettes, chewing tobacco, or electronic cigarettes. If you need help quitting, ask your health care provider.  Try to reduce your stress level, such as with yoga or meditation. Talk with your health care provider if you need help. General Instructions  Watch your dizziness for any changes.  Take medicines only as directed by your health care provider. Talk with your health care provider if you think that your dizziness is caused by a medicine that you are taking.  Tell a friend or a family member that you are feeling dizzy. If he or she notices any changes in your behavior, have this person call your health care provider.  Keep all follow-up visits as directed by your health care provider. This is important. SEEK MEDICAL CARE IF:  Your dizziness does not go away.  Your  dizziness or light-headedness gets worse.  You feel nauseous.  You have reduced hearing.  You have new symptoms.  You are unsteady on your feet or you feel like the room is spinning. SEEK IMMEDIATE MEDICAL CARE IF:  You vomit or have diarrhea and are unable to eat or drink anything.  You have problems talking, walking, swallowing, or using your arms, hands, or legs.  You feel generally weak.  You are not thinking clearly or you have trouble forming sentences. It may take a friend or family member to notice this.  You have chest pain, abdominal pain, shortness of breath, or sweating.  Your vision changes.  You notice any bleeding.  You have a headache.  You have neck pain or a stiff  neck.  You have a fever.   This information is not intended to replace advice given to you by your health care provider. Make sure you discuss any questions you have with your health care provider.   Document Released: 07/23/2000 Document Revised: 06/13/2014 Document Reviewed: 01/23/2014 Elsevier Interactive Patient Education [employer].

## 2015-03-16 ENCOUNTER — Ambulatory Visit (INDEPENDENT_AMBULATORY_CARE_PROVIDER_SITE_OTHER): Payer: BLUE CROSS/BLUE SHIELD | Admitting: Physician Assistant

## 2015-03-16 VITALS — BP 130/80 | HR 71 | Temp 98.3°F | Resp 16 | Ht 70.0 in | Wt 218.2 lb

## 2015-03-16 DIAGNOSIS — R3915 Urgency of urination: Secondary | ICD-10-CM

## 2015-03-16 DIAGNOSIS — R319 Hematuria, unspecified: Secondary | ICD-10-CM

## 2015-03-16 DIAGNOSIS — R300 Dysuria: Secondary | ICD-10-CM | POA: Diagnosis not present

## 2015-03-16 LAB — POCT URINALYSIS DIP (MANUAL ENTRY)
Bilirubin, UA: NEGATIVE
Glucose, UA: NEGATIVE
Leukocytes, UA: NEGATIVE
Nitrite, UA: NEGATIVE
PH UA: 5
Urobilinogen, UA: 1

## 2015-03-16 LAB — POC MICROSCOPIC URINALYSIS (UMFC): MUCUS RE: ABSENT

## 2015-03-16 MED ORDER — PHENAZOPYRIDINE HCL 200 MG PO TABS: 200.0000 mg | ORAL_TABLET | Freq: Three times a day (TID) | ORAL | Status: DC | PRN

## 2015-03-16 NOTE — Progress Notes (Signed)
[facility] [address] [place] [address] [phone]  Date:  03/16/2015   Name:  Charles [name]   DOB:  23-Jan-1956   MRN:  [id number]  PCP:  Kennon Portela, MD    History of Present Illness:  Charles [name] is a 60 y.o. male patient who presents to [facility] for cc of dysuria.  4 days of urinary urgency.  He has a sensation that he has to urinate, though nothing will come at times.  He is still able to produce a significant stream.  He has no pain associated.  He has had no nausea, abdominal pain, back pain, fever, or penile discharge.  He recalls last week brown urine, though confesses that he rarely drinks water.  He states that his HIV is well controlled, and 3 months ago was undetectable among labs with ID doctor.  No hx of kidney stones.  Last sexual partner was about 3 months ago unprotected with unknown sexual hx to patient.   Familial hx of prostate cancer with father.       Patient Active Problem List   Diagnosis Date Noted  . Personal history of colonic adenoma 02/24/2013  . HIV (human immunodeficiency virus infection) ([place]) 05/01/2011    Past Medical History  Diagnosis Date  . HIV positive ([place])   . Arthritis   . Personal history of colonic adenoma 02/24/2013    02/24/2013 diminutive sigmoid polyp - tubular adenoma - repeat colon 2020      Past Surgical History  Procedure Laterality Date  . Prostate biopsy      Social History  Substance Use Topics  . Smoking status: Never Smoker   . Smokeless tobacco: Never Used  . Alcohol Use: No    Family History  Problem Relation Age of Onset  . Pancreatic cancer Mother   . Bladder Cancer Father   . Colon cancer Neg Hx   . Esophageal cancer Neg Hx   . Stomach cancer Neg Hx   . Rectal cancer Neg Hx     No Known Allergies  Medication list has been reviewed and updated.  Current Outpatient Prescriptions on File Prior to Visit  Medication Sig Dispense Refill  .  Abacavir-Dolutegravir-Lamivud (TRIUMEQ) [id number] MG TABS Take by mouth.    . Multiple Vitamin (MULTIVITAMIN) tablet Take 1 tablet by mouth daily.    . valACYclovir (VALTREX) 1000 MG tablet Take 1 tablet (1,000 mg total) by mouth 2 (two) times daily. (Patient not taking: Reported on 03/16/2015) 20 tablet 11   No current facility-administered medications on file prior to visit.    ROS ROS otherwise unremarkable unless listed above.   Physical Examination: BP 130/80 mmHg  Pulse 71  Temp(Src) 98.3 F (36.8 C)  Resp 16  Ht 5\' 10"  (1.778 m)  Wt 218 lb 3.2 oz (98.975 kg)  BMI 31.31 kg/m2  SpO2 98% Ideal Body Weight: Weight in (lb) to have BMI = 25: 173.9  Physical Exam  Constitutional: He is oriented to person, place, and time. He appears well-developed and well-nourished. No distress.  HENT:  Head: Normocephalic and atraumatic.  Eyes: Conjunctivae and EOM are normal. Pupils are equal, round, and reactive to light.  Cardiovascular: Normal rate.   Pulmonary/Chest: Effort normal. No respiratory distress.  Genitourinary: Testes normal. Circumcised.  Mildly erythematous circular patch that lies at the superior penile head and toward the meatus, though no erythema or swelling is visualized at this time.  No scaling.   Neurological:  He is alert and oriented to person, place, and time.  Skin: Skin is warm and dry. He is not diaphoretic.  Psychiatric: He has a normal mood and affect. His behavior is normal.    Results for orders placed or performed in visit on 03/16/15  POCT urinalysis dipstick  Result Value Ref Range   Color, UA yellow yellow   Clarity, UA hazy (A) clear   Glucose, UA negative negative   Bilirubin, UA negative negative   Ketones, POC UA small (15) (A) negative   Spec Grav, UA >=1.030    Blood, UA large (A) negative   pH, UA 5.0    Protein Ur, POC =30 (A) negative   Urobilinogen, UA 1.0    Nitrite, UA Negative Negative   Leukocytes, UA Negative Negative  POCT  Microscopic Urinalysis ([facility])  Result Value Ref Range   WBC,UR,HPF,POC Few (A) None WBC/hpf   RBC,UR,HPF,POC Too numerous to count  (A) None RBC/hpf   Bacteria None None, Too numerous to count   Mucus Absent Absent   Epithelial Cells, UR Per Microscopy None None, Too numerous to count cells/hpf    Assessment and Plan: Charles [name] is a 60 y.o. male who is here today for cc of urinary urgency. Given verbal hx, this may not be sti.  Hematuria should be followed by urology, and referral was made today.  Alarming sxs given to warrant immediate return. Diff dx: stricture, stone, malignancy, sti, uti  Urinary urgency - Plan: POCT urinalysis dipstick, POCT Microscopic Urinalysis ([facility]), RPR, GC/Chlamydia Probe Amp, Urine culture, phenazopyridine (PYRIDIUM) 200 MG tablet, Ambulatory referral to Urology  Hematuria - Plan: Urine culture, phenazopyridine (PYRIDIUM) 200 MG tablet, Ambulatory referral to Urology  Dysuria - Plan: Ambulatory referral to Urology  Ivar Drape, PA-C [facility] 2/3/20175:01 PM

## 2015-03-16 NOTE — Patient Instructions (Signed)
Please take the pyridium as prescribed. I will be in touch with you on the lab results.   I would like you to return this weekend if there is added pain, swelling, frank blood, and/or other intolerable symptoms.

## 2015-03-17 LAB — GC/CHLAMYDIA PROBE AMP
CT Probe RNA: NOT DETECTED
GC PROBE AMP APTIMA: NOT DETECTED

## 2015-03-17 LAB — URINE CULTURE
COLONY COUNT: NO GROWTH
Organism ID, Bacteria: NO GROWTH

## 2015-03-17 LAB — RPR

## 2015-03-18 ENCOUNTER — Telehealth: Payer: Self-pay

## 2015-03-18 DIAGNOSIS — R319 Hematuria, unspecified: Secondary | ICD-10-CM

## 2015-03-18 NOTE — Telephone Encounter (Signed)
Patient wants Charles [name] to know that he passed his Kidney stone 03/18/15 around 3Am this morning so he may not need a urology referral. He will call back 03/19/15 with another update about his progress.

## 2015-03-19 NOTE — Telephone Encounter (Signed)
I called pt with lab results and he asked me if he still needed to go to urology after passing stone. Please advise.

## 2015-03-21 NOTE — Telephone Encounter (Signed)
We can recheck your urine in 2 weeks, to see if the hematuria has resolved.  If the urine analysis is normal, we can cancel urology visit.  However, if you have a urology visit scheduled before this (2 weeks), we will proceed to urology. He can return in 2 weeks for lab recheck

## 2015-03-27 NOTE — Telephone Encounter (Signed)
LMOM informing pt of Stephanie's message.

## 2015-05-01 ENCOUNTER — Telehealth: Payer: Self-pay | Admitting: Family Medicine

## 2015-05-01 NOTE — Telephone Encounter (Signed)
Spoke with patient and he had the flu shot in November

## 2015-05-03 ENCOUNTER — Ambulatory Visit (INDEPENDENT_AMBULATORY_CARE_PROVIDER_SITE_OTHER): Payer: BLUE CROSS/BLUE SHIELD | Admitting: Family Medicine

## 2015-05-03 VITALS — BP 110/78 | HR 76 | Temp 98.1°F | Resp 16 | Ht 70.0 in | Wt 218.0 lb

## 2015-05-03 DIAGNOSIS — J988 Other specified respiratory disorders: Secondary | ICD-10-CM

## 2015-05-03 DIAGNOSIS — J22 Unspecified acute lower respiratory infection: Secondary | ICD-10-CM

## 2015-05-03 DIAGNOSIS — R059 Cough, unspecified: Secondary | ICD-10-CM

## 2015-05-03 DIAGNOSIS — R05 Cough: Secondary | ICD-10-CM

## 2015-05-03 MED ORDER — BENZONATATE 100 MG PO CAPS: 100.0000 mg | ORAL_CAPSULE | Freq: Three times a day (TID) | ORAL | Status: DC | PRN

## 2015-05-03 MED ORDER — AZITHROMYCIN 250 MG PO TABS: ORAL_TABLET | ORAL | Status: DC

## 2015-05-03 NOTE — Progress Notes (Signed)
Subjective:  By signing my name below, I, Moises Blood, attest that this documentation has been prepared under the direction and in the presence of Merri Ray, MD. Electronically Signed: Moises Blood, [place]. 05/03/2015 , 12:27 PM .  Patient was seen in Room 1 .   Patient ID: Charles [name], male    DOB: [birth year], 60 y.o.   MRN: [id number] Chief Complaint  Patient presents with  . Cough    x 1 week  . Nasal Congestion   HPI Charles [name] is a 60 y.o. male Here for cough and nasal congestion that started a week ago.  Has h/o HIV, followed by Dr. Vanessa Ralphs at [place]; most recent lab showed 700 cd4 count, viral undetectable. He takes triumeq.  Also has h/o allergic rhinitis; taken claritin and zyrtec in the past for relief.   Pt states that he noticed a dry cough a week ago with intermittent low-grade fever. Over the weekend (3-5 days ago), he's been fatigue and stayed in bed. His dry cough has become more wet cough and productive (green-yellow). He also mentions 20% energy over the weekend. He informs having some pain when moving his eyes over the course of the weekend. He's taken theraflu over the weekend, 3 times in the 3 days.   He's had much improvement over 1-2 days ago He still has productive cough with chest congestion and would make a "gurgling sound" with the coughs. He noticed some pink in his yellow-green phlegm. He's overall feeling better and with 92% energy today. He had a flu shot in Nov 2016.   He works as a IT consultant.  He has hobbies in driving race cars.   Patient Active Problem List   Diagnosis Date Noted  . Personal history of colonic adenoma 02/24/2013  . HIV (human immunodeficiency virus infection) ([place]) 05/01/2011   Past Medical History  Diagnosis Date  . HIV positive ([place])   . Arthritis   . Personal history of colonic adenoma 02/24/2013    02/24/2013 diminutive sigmoid polyp - tubular adenoma - repeat colon 2020     Past  Surgical History  Procedure Laterality Date  . Prostate biopsy     No Known Allergies Prior to Admission medications   Medication Sig Start Date End Date Taking? Authorizing Provider  Abacavir-Dolutegravir-Lamivud (TRIUMEQ) [id number] MG TABS Take by mouth.   Yes Historical Provider, MD  Multiple Vitamin (MULTIVITAMIN) tablet Take 1 tablet by mouth daily.   Yes Historical Provider, MD   Social History   Social History  . Marital Status: Married    Spouse Name: N/A  . Number of Children: N/A  . Years of Education: N/A   Occupational History  . Not on file.   Social History Main Topics  . Smoking status: Never Smoker   . Smokeless tobacco: Never Used  . Alcohol Use: No  . Drug Use: No  . Sexual Activity: Not on file   Other Topics Concern  . Not on file   Social History Narrative   Review of Systems  Constitutional: Positive for fatigue. Negative for fever and chills.  HENT: Positive for congestion. Negative for rhinorrhea, sinus pressure and sore throat.   Respiratory: Positive for cough. Negative for shortness of breath and wheezing.   Gastrointestinal: Negative for nausea, vomiting, diarrhea and constipation.  Neurological: Negative for headaches.      Objective:   Physical Exam  Constitutional: He is oriented to person, place, and time. He appears well-developed and well-nourished.  HENT:  Head: Normocephalic and atraumatic.  Right Ear: Tympanic membrane, external ear and ear canal normal.  Left Ear: Tympanic membrane, external ear and ear canal normal.  Nose: No rhinorrhea. Right sinus exhibits no maxillary sinus tenderness and no frontal sinus tenderness. Left sinus exhibits no maxillary sinus tenderness and no frontal sinus tenderness.  Mouth/Throat: Oropharynx is clear and moist and mucous membranes are normal. No oropharyngeal exudate or posterior oropharyngeal erythema.  Eyes: Conjunctivae are normal. Pupils are equal, round, and reactive to light.  Neck:  Neck supple.  Cardiovascular: Normal rate, regular rhythm, normal heart sounds and intact distal pulses.   No murmur heard. Pulmonary/Chest: Effort normal and breath sounds normal. He has no wheezes. He has no rhonchi. He has no rales.  Abdominal: Soft. There is no tenderness.  Lymphadenopathy:    He has no cervical adenopathy.  Neurological: He is alert and oriented to person, place, and time.  Skin: Skin is warm and dry. No rash noted.  Psychiatric: He has a normal mood and affect. His behavior is normal.  Vitals reviewed.   Filed Vitals:   05/03/15 1048  BP: 110/78  Pulse: 76  Temp: 98.1 F (36.7 C)  Resp: 16  Height: 5\' 10"  (1.778 m)  Weight: 218 lb (98.884 kg)  SpO2: 98%      Assessment & Plan:   Charles [name] is a 60 y.o. male Cough - Plan: benzonatate (TESSALON) 100 MG capsule  LRTI (lower respiratory tract infection) - Plan: azithromycin (ZITHROMAX) 250 MG tablet Suspected viral illness initially, now with increased productive cough. Lungs are clear, afebrile, and most recent CD4/viral loads would indicate he is not at increased risk of opportunistic infection at this time.  -Trial of Mucinex, Mucinex DM, or Tessalon Perles as needed for cough, fluids, rest, symptomatic care.  -If not improving through this week and her into next week, can start azithromycin, but RTC precautions discussed, including if any hemoptysis, or worsening symptoms.   Meds ordered this encounter  Medications  . azithromycin (ZITHROMAX) 250 MG tablet    Sig: Take 2 pills by mouth on day 1, then 1 pill by mouth per day on days 2 through 5.    Dispense:  6 tablet    Refill:  0  . benzonatate (TESSALON) 100 MG capsule    Sig: Take 1 capsule (100 mg total) by mouth 3 (three) times daily as needed for cough.    Dispense:  20 capsule    Refill:  0   Patient Instructions       IF you received an x-ray today, you will receive an invoice from [facility]. Please contact  [facility] at [phone] with questions or concerns regarding your invoice.   IF you received labwork today, you will receive an invoice from [employer]. Please contact [facility] at [phone] with questions or concerns regarding your invoice.   Our billing staff will not be able to assist you with questions regarding bills from these companies.  You will be contacted with the lab results as soon as they are available. The fastest way to get your results is to activate your My Chart account. Instructions are located on the last page of this paperwork. If you have not heard from [place] regarding the results in 2 weeks, please contact this office.     Your symptoms may still be due to the initial virus.  You can take Mucinex or Mucinex DM over-the-counter, or Tessalon Perles if needed for cough. Drink  plenty fluids, relative rest as needed. If your cough is not improving into next week, or worsening sooner, can start the azithromycin. If you do have fevers, shortness of breath, or blood in the sputum, recommend recheck for possible chest x-ray other testing.  Return to the clinic or go to the nearest emergency room if any of your symptoms worsen or new symptoms occur.   Acute Bronchitis Bronchitis is inflammation of the airways that extend from the windpipe into the lungs (bronchi). The inflammation often causes mucus to develop. This leads to a cough, which is the most common symptom of bronchitis.  In acute bronchitis, the condition usually develops suddenly and goes away over time, usually in a couple weeks. Smoking, allergies, and asthma can make bronchitis worse. Repeated episodes of bronchitis may cause further lung problems.  CAUSES Acute bronchitis is most often caused by the same virus that causes a cold. The virus can spread from person to person (contagious) through coughing, sneezing, and touching contaminated objects. SIGNS AND SYMPTOMS   Cough.    Fever.   Coughing up mucus.   Body aches.   Chest congestion.   Chills.   Shortness of breath.   Sore throat.  DIAGNOSIS  Acute bronchitis is usually diagnosed through a physical exam. Your health care provider will also ask you questions about your medical history. Tests, such as chest X-rays, are sometimes done to rule out other conditions.  TREATMENT  Acute bronchitis usually goes away in a couple weeks. Oftentimes, no medical treatment is necessary. Medicines are sometimes given for relief of fever or cough. Antibiotic medicines are usually not needed but may be prescribed in certain situations. In some cases, an inhaler may be recommended to help reduce shortness of breath and control the cough. A cool mist vaporizer may also be used to help thin bronchial secretions and make it easier to clear the chest.  HOME CARE INSTRUCTIONS  Get plenty of rest.   Drink enough fluids to keep your urine clear or pale yellow (unless you have a medical condition that requires fluid restriction). Increasing fluids may help thin your respiratory secretions (sputum) and reduce chest congestion, and it will prevent dehydration.   Take medicines only as directed by your health care provider.  If you were prescribed an antibiotic medicine, finish it all even if you start to feel better.  Avoid smoking and secondhand smoke. Exposure to cigarette smoke or irritating chemicals will make bronchitis worse. If you are a smoker, consider using nicotine gum or skin patches to help control withdrawal symptoms. Quitting smoking will help your lungs heal faster.   Reduce the chances of another bout of acute bronchitis by washing your hands frequently, avoiding people with cold symptoms, and trying not to touch your hands to your mouth, nose, or eyes.   Keep all follow-up visits as directed by your health care provider.  SEEK MEDICAL CARE IF: Your symptoms do not improve after 1 week of  treatment.  SEEK IMMEDIATE MEDICAL CARE IF:  You develop an increased fever or chills.   You have chest pain.   You have severe shortness of breath.  You have bloody sputum.   You develop dehydration.  You faint or repeatedly feel like you are going to pass out.  You develop repeated vomiting.  You develop a severe headache. MAKE SURE YOU:   Understand these instructions.  Will watch your condition.  Will get help right away if you are not doing well or get worse.  This information is not intended to replace advice given to you by your health care provider. Make sure you discuss any questions you have with your health care provider.   Document Released: 03/06/2004 Document Revised: 02/17/2014 Document Reviewed: 07/20/2012 Elsevier Interactive Patient Education [employer].     I personally performed the services described in this documentation, which was scribed in my presence. The recorded information has been reviewed and considered, and addended by me as needed.

## 2015-05-03 NOTE — Patient Instructions (Addendum)
IF you received an x-ray today, you will receive an invoice from [facility]. Please contact [facility] at [phone] with questions or concerns regarding your invoice.   IF you received labwork today, you will receive an invoice from [employer]. Please contact [facility] at [phone] with questions or concerns regarding your invoice.   Our billing staff will not be able to assist you with questions regarding bills from these companies.  You will be contacted with the lab results as soon as they are available. The fastest way to get your results is to activate your My Chart account. Instructions are located on the last page of this paperwork. If you have not heard from [place] regarding the results in 2 weeks, please contact this office.     Your symptoms may still be due to the initial virus.  You can take Mucinex or Mucinex DM over-the-counter, or Tessalon Perles if needed for cough. Drink plenty fluids, relative rest as needed. If your cough is not improving into next week, or worsening sooner, can start the azithromycin. If you do have fevers, shortness of breath, or blood in the sputum, recommend recheck for possible chest x-ray other testing.  Return to the clinic or go to the nearest emergency room if any of your symptoms worsen or new symptoms occur.   Acute Bronchitis Bronchitis is inflammation of the airways that extend from the windpipe into the lungs (bronchi). The inflammation often causes mucus to develop. This leads to a cough, which is the most common symptom of bronchitis.  In acute bronchitis, the condition usually develops suddenly and goes away over time, usually in a couple weeks. Smoking, allergies, and asthma can make bronchitis worse. Repeated episodes of bronchitis may cause further lung problems.  CAUSES Acute bronchitis is most often caused by the same virus that causes a cold. The virus can spread from person to person  (contagious) through coughing, sneezing, and touching contaminated objects. SIGNS AND SYMPTOMS   Cough.   Fever.   Coughing up mucus.   Body aches.   Chest congestion.   Chills.   Shortness of breath.   Sore throat.  DIAGNOSIS  Acute bronchitis is usually diagnosed through a physical exam. Your health care provider will also ask you questions about your medical history. Tests, such as chest X-rays, are sometimes done to rule out other conditions.  TREATMENT  Acute bronchitis usually goes away in a couple weeks. Oftentimes, no medical treatment is necessary. Medicines are sometimes given for relief of fever or cough. Antibiotic medicines are usually not needed but may be prescribed in certain situations. In some cases, an inhaler may be recommended to help reduce shortness of breath and control the cough. A cool mist vaporizer may also be used to help thin bronchial secretions and make it easier to clear the chest.  HOME CARE INSTRUCTIONS  Get plenty of rest.   Drink enough fluids to keep your urine clear or pale yellow (unless you have a medical condition that requires fluid restriction). Increasing fluids may help thin your respiratory secretions (sputum) and reduce chest congestion, and it will prevent dehydration.   Take medicines only as directed by your health care provider.  If you were prescribed an antibiotic medicine, finish it all even if you start to feel better.  Avoid smoking and secondhand smoke. Exposure to cigarette smoke or irritating chemicals will make bronchitis worse. If you are a smoker, consider using nicotine gum or skin patches to  help control withdrawal symptoms. Quitting smoking will help your lungs heal faster.   Reduce the chances of another bout of acute bronchitis by washing your hands frequently, avoiding people with cold symptoms, and trying not to touch your hands to your mouth, nose, or eyes.   Keep all follow-up visits as directed by  your health care provider.  SEEK MEDICAL CARE IF: Your symptoms do not improve after 1 week of treatment.  SEEK IMMEDIATE MEDICAL CARE IF:  You develop an increased fever or chills.   You have chest pain.   You have severe shortness of breath.  You have bloody sputum.   You develop dehydration.  You faint or repeatedly feel like you are going to pass out.  You develop repeated vomiting.  You develop a severe headache. MAKE SURE YOU:   Understand these instructions.  Will watch your condition.  Will get help right away if you are not doing well or get worse.   This information is not intended to replace advice given to you by your health care provider. Make sure you discuss any questions you have with your health care provider.   Document Released: 03/06/2004 Document Revised: 02/17/2014 Document Reviewed: 07/20/2012 Elsevier Interactive Patient Education [employer].

## 2016-04-28 DIAGNOSIS — L820 Inflamed seborrheic keratosis: Secondary | ICD-10-CM | POA: Diagnosis not present

## 2016-04-28 DIAGNOSIS — D2239 Melanocytic nevi of other parts of face: Secondary | ICD-10-CM | POA: Diagnosis not present

## 2016-06-18 DIAGNOSIS — Z1159 Encounter for screening for other viral diseases: Secondary | ICD-10-CM | POA: Diagnosis not present

## 2016-11-03 ENCOUNTER — Ambulatory Visit (INDEPENDENT_AMBULATORY_CARE_PROVIDER_SITE_OTHER): Payer: 59 | Admitting: Family Medicine

## 2016-11-03 ENCOUNTER — Encounter: Payer: Self-pay | Admitting: Family Medicine

## 2016-11-03 VITALS — BP 122/82 | HR 94 | Temp 98.0°F | Resp 16 | Ht 69.29 in | Wt 220.0 lb

## 2016-11-03 DIAGNOSIS — L03011 Cellulitis of right finger: Secondary | ICD-10-CM | POA: Diagnosis not present

## 2016-11-03 DIAGNOSIS — Z23 Encounter for immunization: Secondary | ICD-10-CM

## 2016-11-03 DIAGNOSIS — R21 Rash and other nonspecific skin eruption: Secondary | ICD-10-CM | POA: Diagnosis not present

## 2016-11-03 MED ORDER — VALACYCLOVIR HCL 1 G PO TABS: 1000.0000 mg | ORAL_TABLET | Freq: Every day | ORAL | 0 refills | Status: AC

## 2016-11-03 MED ORDER — DOXYCYCLINE HYCLATE 100 MG PO TABS: 100.0000 mg | ORAL_TABLET | Freq: Two times a day (BID) | ORAL | 0 refills | Status: DC

## 2016-11-03 NOTE — Progress Notes (Signed)
Subjective:  By signing my name below, I, Charles [name], attest that this documentation has been prepared under the direction and in the presence of Merri Ray, MD. Electronically Signed: Moises [name], [place]. 11/03/2016 , 4:58 PM .  Patient was seen in Room 12 .   Patient ID: Charles [name], male    DOB: 27-Jan-1956, 61 y.o.   MRN: [id number] Chief Complaint  Patient presents with  . herpes Simplex    med refill valtrex   HPI Charles [name] is a 61 y.o. male Here for medication refill of his Valtrex and other multiple concerns today.   Valtrex He's been taking Valtrex for HSV intermittently since 2013. He would take it once or twice a year for genital herpes around his anus. He would usually feel a sensation, and take 1 pill 1000mg  BID for 2-3 days for relief. His HSV testing was in 2013, but 1 and 2 IGG were positive in March 2013, and treated for syphilis in 2014 (relief with 2 shots of penicillin).   He is followed by Dr. Vanessa Ralphs in [place] for HIV.   Penile bump He mentions noticing a bump on his penile head about 4 days ago. He's had a different partner since 2013 testing, but same partner since 2017 testing. Usually sexually activity with condoms, but occasionally without. He denies any new sexual partners recently. He denies any penile discharge, penile pain, or trouble with urination.   Right Thumb infection He noticed a possible infection in his right thumb. He usually has in grown nails and was trimming his nail. He has applied polysporin to the area for relief in the past. But, this time he's noticed a greenish-reddish area over the past day. He denies fever, chills or night sweats.   Arthralgia He also mentions having some arthralgia. He requested referral to see ortho he's seen 8 years ago. He agrees to be seen again for this topic.   Patient Active Problem List   Diagnosis Date Noted  . Personal history of colonic adenoma 02/24/2013  . HIV (human  immunodeficiency virus infection) ([place]) 05/01/2011   Past Medical History:  Diagnosis Date  . Arthritis   . HIV positive ([place])   . Personal history of colonic adenoma 02/24/2013   02/24/2013 diminutive sigmoid polyp - tubular adenoma - repeat colon 2020     Past Surgical History:  Procedure Laterality Date  . PROSTATE BIOPSY     No Known Allergies Prior to Admission medications   Medication Sig Start Date End Date Taking? Authorizing Provider  Abacavir-Dolutegravir-Lamivud (TRIUMEQ) [phone] MG TABS Take by mouth.    [provider]  azithromycin (ZITHROMAX) 250 MG tablet Take 2 pills by mouth on day 1, then 1 pill by mouth per day on days 2 through 5. 05/03/15   Wendie Agreste, MD  benzonatate (TESSALON) 100 MG capsule Take 1 capsule (100 mg total) by mouth 3 (three) times daily as needed for cough. 05/03/15   Wendie Agreste, MD  Multiple Vitamin (MULTIVITAMIN) tablet Take 1 tablet by mouth daily.    [provider]   Social History   Social History  . Marital status: Married    Spouse name: N/A  . Number of children: N/A  . Years of education: N/A   Occupational History  . Not on file.   Social History Main Topics  . Smoking status: Never Smoker  . Smokeless tobacco: Never Used  . Alcohol use No  . Drug use: No  .  Sexual activity: Not on file   Other Topics Concern  . Not on file   Social History Narrative  . No narrative on file   Review of Systems  Constitutional: Negative for fatigue and unexpected weight change.  Eyes: Negative for visual disturbance.  Respiratory: Negative for cough, chest tightness and shortness of breath.   Cardiovascular: Negative for chest pain, palpitations and leg swelling.  Gastrointestinal: Negative for abdominal pain and [name] in stool.  Genitourinary: Positive for genital sores. Negative for discharge, dysuria, hematuria, penile pain and urgency.  Skin: Positive for color change.  Neurological: Negative for  dizziness, light-headedness and headaches.       Objective:   Physical Exam  Constitutional: He is oriented to person, place, and time. He appears well-developed and well-nourished. No distress.  HENT:  Head: Normocephalic and atraumatic.  Eyes: Pupils are equal, round, and reactive to light. EOM are normal.  Neck: Neck supple.  Cardiovascular: Normal rate.   Pulmonary/Chest: Effort normal. No respiratory distress.  Genitourinary:  Genitourinary Comments: Slight ulceration appearance with slight erythematous surrounding measuring approximately 38mm at the right head of the penis; did not see any other surrounding blisters; small red patch area of skin in the middle right side of penile shaft; no penile discharge seen  Musculoskeletal: Normal range of motion.  Neurological: He is alert and oriented to person, place, and time.  Skin: Skin is warm and dry.  Right thumb: ulnar aspect of the nail fold, there is erythema to the base with white green appearance just lateral to the nail; no active discharge  Psychiatric: He has a normal mood and affect. His behavior is normal.  Nursing note and vitals reviewed.   Vitals:   11/03/16 1559  BP: 122/82  Pulse: 94  Resp: 16  Temp: 98 F (36.7 C)  TempSrc: Oral  SpO2: 96%  Weight: 220 lb (99.8 kg)  Height: 5' 9.29" (1.76 m)   Procedure: verbal consent obtained after discussion of procedure; ethyl chloride spray after betadine x2 then alcohol swab to right thumb. #15 blade to lateral nail fold, 2-36mm incision with white exudate expressed. Small amount of [name], bleeding stopped with gentle pressure; no complications. Bandage applied.  Culture obtained.    Assessment & Plan:   Charles [name] is a 61 y.o. male Paronychia of right thumb - Plan: doxycycline (VIBRA-TABS) 100 MG tablet, WOUND CULTURE  -  Incise, drain as above without complication.  Start doxycycline, warm soaks with gentl pessureto express any further exudate, check culture and  repeat turn for repeat exam in 48-72 hours.  Need for prophylactic vaccination and inoculation against influenza - Plan: Flu Vaccine QUAD 36+ mos IM  Penile rash - Plan: valACYclovir (VALTREX) 1000 MG tablet, GC/Chlamydia Probe Amp, RPR, Herpes simplex virus culture  - History of syphilis, current rash may be HSVversus abrasion. Does not appea typical of HPV/genital art at this time , and there is no deep ulcer appearance.  - recheck RPR as  Most recently nonreactive, as well as chlamydia and gonorrhea testing. HSV culture obtained from area,  But without fluid, this may be falsely negative.  - Valtrex refilled, okay to use for now, but may be beyond the timing of effectiveness if this were from HSV. RTC precautions if worse  Meds ordered this encounter  Medications  . DISCONTD: valACYclovir (VALTREX) 1000 MG tablet    Sig: Take 1,000 mg by mouth 2 (two) times daily.  Marland Kitchen doxycycline (VIBRA-TABS) 100 MG tablet  Sig: Take 1 tablet (100 mg total) by mouth 2 (two) times daily.    Dispense:  20 tablet    Refill:  0  . valACYclovir (VALTREX) 1000 MG tablet    Sig: Take 1 tablet (1,000 mg total) by mouth daily. For 5 days per occurrence.    Dispense:  30 tablet    Refill:  0   Patient Instructions   Okay to start Valtrex for recurrent penile rash, but if that area is not improving in the next 1-2 weeks, return to discuss other potential causes. I will check some [name] work, but potentially may need to repeat RPR in 6 weeks  Start doxycycline 1 pill twice per day for infected skin by nail.Warm soaks with gentle pressure to express pus 2 times per day, and recheck in 2-3 days.   Return to the clinic or go to the nearest emergency room if any of your symptoms worsen or new symptoms occur.  Paronychia Paronychia is an infection of the skin that surrounds a nail. It usually affects the skin around a fingernail, but it may also occur near a toenail. It often causes pain and swelling around the  nail. This condition may come on suddenly or develop over a longer period. In some cases, a collection of pus (abscess) can form near or under the nail. Usually, paronychia is not serious and it clears up with treatment. What are the causes? This condition may be caused by bacteria or fungi. It is commonly caused by either Streptococcus or Staphylococcus bacteria. The bacteria or fungi often cause the infection by getting into the affected area through an opening in the skin, such as a cut or a hangnail. What increases the risk? This condition is more likely to develop in:  People who get their hands wet often, such as those who work as Designer, industrial/product, bartenders, or nurses.  People who bite their fingernails or suck their thumbs.  People who trim their nails too short.  People who have hangnails or injured fingertips.  People who get manicures.  People who have diabetes.  What are the signs or symptoms? Symptoms of this condition include:  Redness and swelling of the skin near the nail.  Tenderness around the nail when you touch the area.  Pus-filled bumps under the cuticle. The cuticle is the skin at the base or sides of the nail.  Fluid or pus under the nail.  Throbbing pain in the area.  How is this diagnosed? This condition is usually diagnosed with a physical exam. In some cases, a sample of pus may be taken from an abscess to be tested in a lab. This can help to determine what type of bacteria or fungi is causing the condition. How is this treated? Treatment for this condition depends on the cause and severity of the condition. If the condition is mild, it may clear up on its own in a few days. Your health care provider may recommend soaking the affected area in warm water a few times a day. When treatment is needed, the options may include:  Antibiotic medicine, if the condition is caused by a bacterial infection.  Antifungal medicine, if the condition is caused by a fungal  infection.  Incision and drainage, if an abscess is present. In this procedure, the health care provider will cut open the abscess so the pus can drain out.  Follow these instructions at home:  Soak the affected area in warm water if directed to do so by  your health care provider. You may be told to do this for 20 minutes, 2-3 times a day. Keep the area dry in between soakings.  Take medicines only as directed by your health care provider.  If you were prescribed an antibiotic medicine, finish all of it even if you start to feel better.  Keep the affected area clean.  Do not try to drain a fluid-filled bump yourself.  If you will be washing dishes or performing other tasks that require your hands to get wet, wear rubber gloves. You should also wear gloves if your hands might come in contact with irritating substances, such as cleaners or chemicals.  Follow your health care provider's instructions about: ? Wound care. ? Bandage (dressing) changes and removal. Contact a health care provider if:  Your symptoms get worse or do not improve with treatment.  You have a fever or chills.  You have redness spreading from the affected area.  You have continued or increased fluid, [name], or pus coming from the affected area.  Your finger or knuckle becomes swollen or is difficult to move. This information is not intended to replace advice given to you by your health care provider. Make sure you discuss any questions you have with your health care provider. Document Released: 07/23/2000 Document Revised: 07/05/2015 Document Reviewed: 01/04/2014 Elsevier Interactive Patient Education  2018 [employer].       IF you received an x-ray today, you will receive an invoice from [facility]. Please contact [facility] at [phone] with questions or concerns regarding your invoice.   IF you received labwork today, you will receive an invoice from [facility]. Please contact  [facility] at [phone] with questions or concerns regarding your invoice.   Our billing staff will not be able to assist you with questions regarding bills from these companies.  You will be contacted with the lab results as soon as they are available. The fastest way to get your results is to activate your My Chart account. Instructions are located on the last page of this paperwork. If you have not heard from [place] regarding the results in 2 weeks, please contact this office.       I personally performed the services described in this documentation, which was scribed in my presence. The recorded information has been reviewed and considered for accuracy and completeness, addended by me as needed, and agree with information above.  Signed,   Merri Ray, MD [facility].  11/05/16 12:42 PM

## 2016-11-03 NOTE — Patient Instructions (Addendum)
Okay to start Valtrex for recurrent penile rash, but if that area is not improving in the next 1-2 weeks, return to discuss other potential causes. I will check some blood work, but potentially may need to repeat RPR in 6 weeks  Start doxycycline 1 pill twice per day for infected skin by nail.Warm soaks with gentle pressure to express pus 2 times per day, and recheck in 2-3 days.   Return to the clinic or go to the nearest emergency room if any of your symptoms worsen or new symptoms occur.  Paronychia Paronychia is an infection of the skin that surrounds a nail. It usually affects the skin around a fingernail, but it may also occur near a toenail. It often causes pain and swelling around the nail. This condition may come on suddenly or develop over a longer period. In some cases, a collection of pus (abscess) can form near or under the nail. Usually, paronychia is not serious and it clears up with treatment. What are the causes? This condition may be caused by bacteria or fungi. It is commonly caused by either Streptococcus or Staphylococcus bacteria. The bacteria or fungi often cause the infection by getting into the affected area through an opening in the skin, such as a cut or a hangnail. What increases the risk? This condition is more likely to develop in:  People who get their hands wet often, such as those who work as Designer, industrial/product, bartenders, or nurses.  People who bite their fingernails or suck their thumbs.  People who trim their nails too short.  People who have hangnails or injured fingertips.  People who get manicures.  People who have diabetes.  What are the signs or symptoms? Symptoms of this condition include:  Redness and swelling of the skin near the nail.  Tenderness around the nail when you touch the area.  Pus-filled bumps under the cuticle. The cuticle is the skin at the base or sides of the nail.  Fluid or pus under the nail.  Throbbing pain in the  area.  How is this diagnosed? This condition is usually diagnosed with a physical exam. In some cases, a sample of pus may be taken from an abscess to be tested in a lab. This can help to determine what type of bacteria or fungi is causing the condition. How is this treated? Treatment for this condition depends on the cause and severity of the condition. If the condition is mild, it may clear up on its own in a few days. Your health care provider may recommend soaking the affected area in warm water a few times a day. When treatment is needed, the options may include:  Antibiotic medicine, if the condition is caused by a bacterial infection.  Antifungal medicine, if the condition is caused by a fungal infection.  Incision and drainage, if an abscess is present. In this procedure, the health care provider will cut open the abscess so the pus can drain out.  Follow these instructions at home:  Soak the affected area in warm water if directed to do so by your health care provider. You may be told to do this for 20 minutes, 2-3 times a day. Keep the area dry in between soakings.  Take medicines only as directed by your health care provider.  If you were prescribed an antibiotic medicine, finish all of it even if you start to feel better.  Keep the affected area clean.  Do not try to drain a fluid-filled bump yourself.  If you will be washing dishes or performing other tasks that require your hands to get wet, wear rubber gloves. You should also wear gloves if your hands might come in contact with irritating substances, such as cleaners or chemicals.  Follow your health care provider's instructions about: ? Wound care. ? Bandage (dressing) changes and removal. Contact a health care provider if:  Your symptoms get worse or do not improve with treatment.  You have a fever or chills.  You have redness spreading from the affected area.  You have continued or increased fluid, blood, or  pus coming from the affected area.  Your finger or knuckle becomes swollen or is difficult to move. This information is not intended to replace advice given to you by your health care provider. Make sure you discuss any questions you have with your health care provider. Document Released: 07/23/2000 Document Revised: 07/05/2015 Document Reviewed: 01/04/2014 Elsevier Interactive Patient Education  2018 [employer].       IF you received an x-ray today, you will receive an invoice from [facility]. Please contact [facility] at [phone] with questions or concerns regarding your invoice.   IF you received labwork today, you will receive an invoice from [facility]. Please contact [facility] at [phone] with questions or concerns regarding your invoice.   Our billing staff will not be able to assist you with questions regarding bills from these companies.  You will be contacted with the lab results as soon as they are available. The fastest way to get your results is to activate your My Chart account. Instructions are located on the last page of this paperwork. If you have not heard from [place] regarding the results in 2 weeks, please contact this office.

## 2016-11-04 LAB — GC/CHLAMYDIA PROBE AMP
Chlamydia trachomatis, NAA: NEGATIVE
NEISSERIA GONORRHOEAE BY PCR: NEGATIVE

## 2016-11-04 LAB — RPR, QUANT+TP ABS (REFLEX): T Pallidum Abs: POSITIVE — AB

## 2016-11-04 LAB — RPR: RPR: REACTIVE — AB

## 2016-11-05 LAB — HERPES SIMPLEX VIRUS CULTURE

## 2016-11-06 ENCOUNTER — Ambulatory Visit (INDEPENDENT_AMBULATORY_CARE_PROVIDER_SITE_OTHER): Payer: 59 | Admitting: Family Medicine

## 2016-11-06 ENCOUNTER — Encounter: Payer: Self-pay | Admitting: Family Medicine

## 2016-11-06 VITALS — BP 112/86 | HR 94 | Temp 98.1°F | Resp 16 | Ht 69.29 in | Wt 220.2 lb

## 2016-11-06 DIAGNOSIS — A510 Primary genital syphilis: Secondary | ICD-10-CM

## 2016-11-06 DIAGNOSIS — L03011 Cellulitis of right finger: Secondary | ICD-10-CM

## 2016-11-06 LAB — WOUND CULTURE

## 2016-11-06 MED ORDER — PENICILLIN G BENZATHINE 1200000 UNIT/2ML IM SUSP
2.4000 10*6.[IU] | Freq: Once | INTRAMUSCULAR | Status: AC
  Administered 2016-11-06: 2.4 10*6.[IU] via INTRAMUSCULAR

## 2016-11-06 NOTE — Progress Notes (Signed)
Subjective:  This chart was scribed for Wendie Agreste, MD by Tamsen Roers, at [place] at [facility].  This patient was seen in room 11 and the patient's care was started at 11:09 AM .   Chief Complaint  Patient presents with  . Follow-up     Patient ID: Charles [name], male    DOB: [birth year], 61 y.o.   MRN: [id number]  HPI HPI Comments: Charles [name] is a 61 y.o. male who presents to Primary Care at [facility] for a follow up of his thumb.    Thumb perionychia: Incised and drained three days ago.  Started on doxycycline. Culture is still pending. --- Patient has been soaking his thumb.  He has been compliant with his doxycycline, without side effects.   Penile lesion: See last visit.  He had a history of treated syphilis in 2014.  Received penicillin 2.4 units IM.  He had subsequent negative RPRs, most recently February 2017. Testing three days ago was negative for GC/CT but reactive for RPR with positive T pallidum antibodies with 1:64 titer.  HSV culture was negative.  Patient Active Problem List   Diagnosis Date Noted  . Personal history of colonic adenoma 02/24/2013  . HIV (human immunodeficiency virus infection) ([place]) 05/01/2011   Past Medical History:  Diagnosis Date  . Arthritis   . HIV positive ([place])   . Personal history of colonic adenoma 02/24/2013   02/24/2013 diminutive sigmoid polyp - tubular adenoma - repeat colon 2020     Past Surgical History:  Procedure Laterality Date  . PROSTATE BIOPSY     Allergies  Allergen Reactions  . Efavirenz Rash  . Emtricitabine-Tenofovir Df Rash   Prior to Admission medications   Medication Sig Start Date End Date Taking? Authorizing Provider  Abacavir-Dolutegravir-Lamivud (TRIUMEQ) [phone] MG TABS Take by mouth.    [provider]  doxycycline (VIBRA-TABS) 100 MG tablet Take 1 tablet (100 mg total) by mouth 2 (two) times daily. 11/03/16   Wendie Agreste, MD  valACYclovir (VALTREX) 1000 MG tablet  Take 1 tablet (1,000 mg total) by mouth daily. For 5 days per occurrence. 11/03/16   Wendie Agreste, MD   Social History   Social History  . Marital status: Married    Spouse name: N/A  . Number of children: N/A  . Years of education: N/A   Occupational History  . Not on file.   Social History Main Topics  . Smoking status: Never Smoker  . Smokeless tobacco: Never Used  . Alcohol use No  . Drug use: No  . Sexual activity: Not on file   Other Topics Concern  . Not on file   Social History Narrative  . No narrative on file     Review of Systems  Constitutional: Negative for chills and fever.  Eyes: Negative for pain, redness and itching.  Gastrointestinal: Negative for nausea and vomiting.  Genitourinary: Positive for genital sores.  Musculoskeletal: Negative for neck pain and neck stiffness.  Skin: Positive for wound.  Neurological: Negative for syncope and speech difficulty.       Objective:   Physical Exam  Constitutional: No distress.  HENT:  Head: Normocephalic and atraumatic.  Pulmonary/Chest: Effort normal. No respiratory distress.  Genitourinary:  Genitourinary Comments: Approximately 2 to 3 mm lesion on the dorsal head of his penis, slightly indurated, no discharge, non tender. There was no apparent lymphadenopathy in the groin.   Neurological: He is alert.  Skin:  Right thumb: there  is a small 1-2 mm area of white appearance at the ulnar nail fold but no significant erythema or induration. Minimal tenderness.     Vitals:   11/06/16 1032  BP: 112/86  Pulse: 94  Resp: 16  Temp: 98.1 F (36.7 C)  SpO2: 97%  Weight: 220 lb 3.2 oz (99.9 kg)  Height: 5' 9.29" (1.76 m)       Assessment & Plan:   Charles [name] is a 61 y.o. male Paronychia of right thumb  - improving. Continue soaks, expression of exudate, and continue doxycycline. rtc precautions.   Primary genital syphilis - Plan: penicillin g benzathine (BICILLIN LA) 1200000 UNIT/2ML  injection 2.4 Million Units  - positive RPR and confirmatory testing after prior nonreactive.    - PCN 2.4 million units x1, partner to be tested/treated, safer sex practices discussed.   Meds ordered this encounter  Medications  . penicillin g benzathine (BICILLIN LA) 1200000 UNIT/2ML injection 2.4 Million Units    Order Specific Question:   Antibiotic Indication:    Answer:   Syphilis   Patient Instructions    Thumb appears to be improving. Continue soaks and gentle pressure to express any remaining pus. I should have the culture back in the next few days, but continue doxycycline for now. If any increased redness, swelling, or does not continue to improve into next week, please return for recheck.  Unfortunately testing does indicate likely infection with syphilis. Make sure your partner is tested/treated. Condoms every time for now. If lesion does not improve with treatment as discussed, please return to look into other causes. Return to the clinic or go to the nearest emergency room if any of your symptoms worsen or new symptoms occur.  Syphilis Syphilis is an infectious disease. It can cause serious complications if left untreated. What are the causes? Syphilis is caused by a type of bacteria called Treponema pallidum. It is most commonly spread through sexual contact. Syphilis may also spread to a fetus through the blood of the mother. What are the signs or symptoms? Symptoms vary depending on the stage of the disease. Some symptoms may disappear without treatment. However, this does not mean that the infection is gone. One form of syphilis (called latent syphilis) has no symptoms. Primary Syphilis  Painless sores (chancres) in and around the genital organs and mouth.  Swollen lymph nodes near the sores. Secondary Syphilis  A rash or sores over any portion of the body, including the palms of the hands and soles of the feet.  Fever.  Headache.  Sore throat.  Swollen lymph  nodes.  New sores in the mouth or on the genitals.  Feeling generally ill.  Having pain in the joints. Tertiary Syphilis The third stage of syphilis involves severe damage to different organs in the body, such as the brain, spinal cord, and heart. Signs and symptoms may include:  Dementia.  Personality and mood changes.  Difficulty walking.  Heart failure.  Fainting.  Enlargement (aneurysm) of the aorta.  Tumors of the skin, bones, or liver.  Muscle weakness.  Sudden "lightning" pains, numbness, or tingling.  Problems with coordination.  Vision changes.  How is this diagnosed?  A physical exam will be done.  Blood tests will be done to confirm the diagnosis.  If the disease is in the first or second stages, a fluid (drainage) sample from a sore or rash may be examined under a microscope to detect the disease-causing bacteria.  Fluid around the spine may need  to be examined to detect brain damage or inflammation of the brain lining (meningitis).  If the disease is in the third stage, X-rays, CT scans, MRIs, echocardiograms, ultrasounds, or cardiac catheterization may also be done to detect disease of the heart, aorta, or brain. How is this treated? Syphilis can be cured with antibiotic medicine if a diagnosis is made early. During the first day of treatment, you may experience fever, chills, headache, nausea, or aching all over your body. This is a normal reaction to the antibiotics. Follow these instructions at home:  Take your antibiotic medicine as directed by your health care provider. Finish the antibiotic even if you start to feel better. Incomplete treatment will put you at risk for continued infection and could be life threatening.  Take medicines only as directed by your health care provider.  Do not have sexual intercourse until your treatment is completed or as directed by your health care provider.  Inform your recent sexual partners that you were  diagnosed with syphilis. They need to seek care and treatment, even if they have no symptoms. It is necessary that all your sexual partners be tested for infection and treated if they have the disease.  Keep all follow-up visits as directed by your health care provider. It is important to keep all your appointments.  If your test results are not ready during your visit, make an appointment with your health care provider to find out the results. Do not assume everything is normal if you have not heard from your health care provider or the medical facility. It is your responsibility to get your test results. Contact a health care provider if:  You continue to have any of the following 24 hours after beginning treatment: ? Fever. ? Chills. ? Headache. ? Nausea. ? Aching all over your body.  You have symptoms of an allergic reaction to medicine, such as: ? Chills. ? A headache. ? Light-headedness. ? A new rash (especially hives). ? Difficulty breathing. This information is not intended to replace advice given to you by your health care provider. Make sure you discuss any questions you have with your health care provider. Document Released: 11/17/2012 Document Revised: 07/05/2015 Document Reviewed: 08/17/2012 Elsevier Interactive Patient Education  2017 [place] is an infection of the skin that surrounds a nail. It usually affects the skin around a fingernail, but it may also occur near a toenail. It often causes pain and swelling around the nail. This condition may come on suddenly or develop over a longer period. In some cases, a collection of pus (abscess) can form near or under the nail. Usually, paronychia is not serious and it clears up with treatment. What are the causes? This condition may be caused by bacteria or fungi. It is commonly caused by either Streptococcus or Staphylococcus bacteria. The bacteria or fungi often cause the infection by getting into the  affected area through an opening in the skin, such as a cut or a hangnail. What increases the risk? This condition is more likely to develop in:  People who get their hands wet often, such as those who work as Designer, industrial/product, bartenders, or nurses.  People who bite their fingernails or suck their thumbs.  People who trim their nails too short.  People who have hangnails or injured fingertips.  People who get manicures.  People who have diabetes.  What are the signs or symptoms? Symptoms of this condition include:  Redness and swelling of the skin near the  nail.  Tenderness around the nail when you touch the area.  Pus-filled bumps under the cuticle. The cuticle is the skin at the base or sides of the nail.  Fluid or pus under the nail.  Throbbing pain in the area.  How is this diagnosed? This condition is usually diagnosed with a physical exam. In some cases, a sample of pus may be taken from an abscess to be tested in a lab. This can help to determine what type of bacteria or fungi is causing the condition. How is this treated? Treatment for this condition depends on the cause and severity of the condition. If the condition is mild, it may clear up on its own in a few days. Your health care provider may recommend soaking the affected area in warm water a few times a day. When treatment is needed, the options may include:  Antibiotic medicine, if the condition is caused by a bacterial infection.  Antifungal medicine, if the condition is caused by a fungal infection.  Incision and drainage, if an abscess is present. In this procedure, the health care provider will cut open the abscess so the pus can drain out.  Follow these instructions at home:  Soak the affected area in warm water if directed to do so by your health care provider. You may be told to do this for 20 minutes, 2-3 times a day. Keep the area dry in between soakings.  Take medicines only as directed by your health  care provider.  If you were prescribed an antibiotic medicine, finish all of it even if you start to feel better.  Keep the affected area clean.  Do not try to drain a fluid-filled bump yourself.  If you will be washing dishes or performing other tasks that require your hands to get wet, wear rubber gloves. You should also wear gloves if your hands might come in contact with irritating substances, such as cleaners or chemicals.  Follow your health care provider's instructions about: ? Wound care. ? Bandage (dressing) changes and removal. Contact a health care provider if:  Your symptoms get worse or do not improve with treatment.  You have a fever or chills.  You have redness spreading from the affected area.  You have continued or increased fluid, blood, or pus coming from the affected area.  Your finger or knuckle becomes swollen or is difficult to move. This information is not intended to replace advice given to you by your health care provider. Make sure you discuss any questions you have with your health care provider. Document Released: 07/23/2000 Document Revised: 07/05/2015 Document Reviewed: 01/04/2014 Elsevier Interactive Patient Education  2018 [employer].     IF you received an x-ray today, you will receive an invoice from [facility]. Please contact [facility] at [phone] with questions or concerns regarding your invoice.   IF you received labwork today, you will receive an invoice from [place]. Please contact [facility] at [phone] with questions or concerns regarding your invoice.   Our billing staff will not be able to assist you with questions regarding bills from these companies.  You will be contacted with the lab results as soon as they are available. The fastest way to get your results is to activate your My Chart account. Instructions are located on the last page of this paperwork. If you have not heard from [place] regarding the  results in 2 weeks, please contact this office.       I personally performed the  services described in this documentation, which was scribed in my presence. The recorded information has been reviewed and considered for accuracy and completeness, addended by me as needed, and agree with information above.  Signed,   Merri Ray, MD [facility] at [place].  11/06/16 9:16 PM

## 2016-11-06 NOTE — Patient Instructions (Addendum)
Thumb appears to be improving. Continue soaks and gentle pressure to express any remaining pus. I should have the culture back in the next few days, but continue doxycycline for now. If any increased redness, swelling, or does not continue to improve into next week, please return for recheck.  Unfortunately testing does indicate likely infection with syphilis. Make sure your partner is tested/treated. Condoms every time for now. If lesion does not improve with treatment as discussed, please return to look into other causes. Return to the clinic or go to the nearest emergency room if any of your symptoms worsen or new symptoms occur.  Syphilis Syphilis is an infectious disease. It can cause serious complications if left untreated. What are the causes? Syphilis is caused by a type of bacteria called Treponema pallidum. It is most commonly spread through sexual contact. Syphilis may also spread to a fetus through the blood of the mother. What are the signs or symptoms? Symptoms vary depending on the stage of the disease. Some symptoms may disappear without treatment. However, this does not mean that the infection is gone. One form of syphilis (called latent syphilis) has no symptoms. Primary Syphilis  Painless sores (chancres) in and around the genital organs and mouth.  Swollen lymph nodes near the sores. Secondary Syphilis  A rash or sores over any portion of the body, including the palms of the hands and soles of the feet.  Fever.  Headache.  Sore throat.  Swollen lymph nodes.  New sores in the mouth or on the genitals.  Feeling generally ill.  Having pain in the joints. Tertiary Syphilis The third stage of syphilis involves severe damage to different organs in the body, such as the brain, spinal cord, and heart. Signs and symptoms may include:  Dementia.  Personality and mood changes.  Difficulty walking.  Heart failure.  Fainting.  Enlargement (aneurysm) of the  aorta.  Tumors of the skin, bones, or liver.  Muscle weakness.  Sudden "lightning" pains, numbness, or tingling.  Problems with coordination.  Vision changes.  How is this diagnosed?  A physical exam will be done.  Blood tests will be done to confirm the diagnosis.  If the disease is in the first or second stages, a fluid (drainage) sample from a sore or rash may be examined under a microscope to detect the disease-causing bacteria.  Fluid around the spine may need to be examined to detect brain damage or inflammation of the brain lining (meningitis).  If the disease is in the third stage, X-rays, CT scans, MRIs, echocardiograms, ultrasounds, or cardiac catheterization may also be done to detect disease of the heart, aorta, or brain. How is this treated? Syphilis can be cured with antibiotic medicine if a diagnosis is made early. During the first day of treatment, you may experience fever, chills, headache, nausea, or aching all over your body. This is a normal reaction to the antibiotics. Follow these instructions at home:  Take your antibiotic medicine as directed by your health care provider. Finish the antibiotic even if you start to feel better. Incomplete treatment will put you at risk for continued infection and could be life threatening.  Take medicines only as directed by your health care provider.  Do not have sexual intercourse until your treatment is completed or as directed by your health care provider.  Inform your recent sexual partners that you were diagnosed with syphilis. They need to seek care and treatment, even if they have no symptoms. It is necessary that  all your sexual partners be tested for infection and treated if they have the disease.  Keep all follow-up visits as directed by your health care provider. It is important to keep all your appointments.  If your test results are not ready during your visit, make an appointment with your health care  provider to find out the results. Do not assume everything is normal if you have not heard from your health care provider or the medical facility. It is your responsibility to get your test results. Contact a health care provider if:  You continue to have any of the following 24 hours after beginning treatment: ? Fever. ? Chills. ? Headache. ? Nausea. ? Aching all over your body.  You have symptoms of an allergic reaction to medicine, such as: ? Chills. ? A headache. ? Light-headedness. ? A new rash (especially hives). ? Difficulty breathing. This information is not intended to replace advice given to you by your health care provider. Make sure you discuss any questions you have with your health care provider. Document Released: 11/17/2012 Document Revised: 07/05/2015 Document Reviewed: 08/17/2012 Elsevier Interactive Patient Education  2017 [place] is an infection of the skin that surrounds a nail. It usually affects the skin around a fingernail, but it may also occur near a toenail. It often causes pain and swelling around the nail. This condition may come on suddenly or develop over a longer period. In some cases, a collection of pus (abscess) can form near or under the nail. Usually, paronychia is not serious and it clears up with treatment. What are the causes? This condition may be caused by bacteria or fungi. It is commonly caused by either Streptococcus or Staphylococcus bacteria. The bacteria or fungi often cause the infection by getting into the affected area through an opening in the skin, such as a cut or a hangnail. What increases the risk? This condition is more likely to develop in:  People who get their hands wet often, such as those who work as Designer, industrial/product, bartenders, or nurses.  People who bite their fingernails or suck their thumbs.  People who trim their nails too short.  People who have hangnails or injured fingertips.  People who  get manicures.  People who have diabetes.  What are the signs or symptoms? Symptoms of this condition include:  Redness and swelling of the skin near the nail.  Tenderness around the nail when you touch the area.  Pus-filled bumps under the cuticle. The cuticle is the skin at the base or sides of the nail.  Fluid or pus under the nail.  Throbbing pain in the area.  How is this diagnosed? This condition is usually diagnosed with a physical exam. In some cases, a sample of pus may be taken from an abscess to be tested in a lab. This can help to determine what type of bacteria or fungi is causing the condition. How is this treated? Treatment for this condition depends on the cause and severity of the condition. If the condition is mild, it may clear up on its own in a few days. Your health care provider may recommend soaking the affected area in warm water a few times a day. When treatment is needed, the options may include:  Antibiotic medicine, if the condition is caused by a bacterial infection.  Antifungal medicine, if the condition is caused by a fungal infection.  Incision and drainage, if an abscess is present. In this procedure, the health care provider  will cut open the abscess so the pus can drain out.  Follow these instructions at home:  Soak the affected area in warm water if directed to do so by your health care provider. You may be told to do this for 20 minutes, 2-3 times a day. Keep the area dry in between soakings.  Take medicines only as directed by your health care provider.  If you were prescribed an antibiotic medicine, finish all of it even if you start to feel better.  Keep the affected area clean.  Do not try to drain a fluid-filled bump yourself.  If you will be washing dishes or performing other tasks that require your hands to get wet, wear rubber gloves. You should also wear gloves if your hands might come in contact with irritating substances, such as  cleaners or chemicals.  Follow your health care provider's instructions about: ? Wound care. ? Bandage (dressing) changes and removal. Contact a health care provider if:  Your symptoms get worse or do not improve with treatment.  You have a fever or chills.  You have redness spreading from the affected area.  You have continued or increased fluid, blood, or pus coming from the affected area.  Your finger or knuckle becomes swollen or is difficult to move. This information is not intended to replace advice given to you by your health care provider. Make sure you discuss any questions you have with your health care provider. Document Released: 07/23/2000 Document Revised: 07/05/2015 Document Reviewed: 01/04/2014 Elsevier Interactive Patient Education  2018 [employer].     IF you received an x-ray today, you will receive an invoice from [facility]. Please contact [facility] at [phone] with questions or concerns regarding your invoice.   IF you received labwork today, you will receive an invoice from [place]. Please contact [facility] at [phone] with questions or concerns regarding your invoice.   Our billing staff will not be able to assist you with questions regarding bills from these companies.  You will be contacted with the lab results as soon as they are available. The fastest way to get your results is to activate your My Chart account. Instructions are located on the last page of this paperwork. If you have not heard from [place] regarding the results in 2 weeks, please contact this office.

## 2017-01-12 ENCOUNTER — Telehealth: Payer: Self-pay

## 2017-01-12 NOTE — Telephone Encounter (Signed)
Pt. Called to obtain the date he was given Penicillin in Dr. Vonna Kotyk office.

## 2017-01-31 ENCOUNTER — Ambulatory Visit (INDEPENDENT_AMBULATORY_CARE_PROVIDER_SITE_OTHER): Payer: 59 | Admitting: Physician Assistant

## 2017-01-31 ENCOUNTER — Encounter: Payer: Self-pay | Admitting: Physician Assistant

## 2017-01-31 VITALS — BP 141/97 | HR 77 | Temp 98.6°F | Resp 16 | Ht 70.0 in | Wt 220.0 lb

## 2017-01-31 DIAGNOSIS — Z23 Encounter for immunization: Secondary | ICD-10-CM | POA: Diagnosis not present

## 2017-01-31 DIAGNOSIS — S61211A Laceration without foreign body of left index finger without damage to nail, initial encounter: Secondary | ICD-10-CM | POA: Diagnosis not present

## 2017-01-31 NOTE — Patient Instructions (Addendum)
WOUND CARE Please return in 10 days to have your stitches/staples removed or sooner if you have concerns. . Keep area clean and dry for 24 hours. Do not remove bandage, if applied. . After 24 hours, remove bandage and wash wound gently with mild soap and warm water. Reapply a new bandage after cleaning wound, if directed. . Continue daily cleansing with soap and water until stitches/staples are removed. . Do not apply any ointments or creams to the wound while stitches/staples are in place, as this may cause delayed healing. . Notify the office if you experience any of the following signs of infection: Swelling, redness, pus drainage, streaking, fever >101.0 F . Notify the office if you experience excessive bleeding that does not stop after 15-20 minutes of constant, firm pressure.      IF you received an x-ray today, you will receive an invoice from [facility]. Please contact  Radiology at [phone] with questions or concerns regarding your invoice.   IF you received labwork today, you will receive an invoice from [facility]. Please contact [facility] at [phone] with questions or concerns regarding your invoice.   Our billing staff will not be able to assist you with questions regarding bills from these companies.  You will be contacted with the lab results as soon as they are available. The fastest way to get your results is to activate your My Chart account. Instructions are located on the last page of this paperwork. If you have not heard from us regarding the results in 2 weeks, please contact this office.      

## 2017-01-31 NOTE — Progress Notes (Signed)
    01/31/2017 10:40 AM   DOB: 10-23-1955 / MRN: [id number]  SUBJECTIVE:  Charles [name] is a 61 y.o. male presenting for laceration the the 2nd digit on the left hand. No weakness or numbness.  This occurred while trying "cut a bottle."  TD is distant.   Immunization History  Administered Date(s) Administered  . Influenza,inj,Quad PF,6+ Mos 11/03/2016  . Influenza-Unspecified 12/26/2014  . Tdap 01/31/2017     He is allergic to efavirenz and emtricitabine-tenofovir df.   He  has a past medical history of Arthritis, HIV positive ([place]), and Personal history of colonic adenoma (02/24/2013).    He  reports that  has never smoked. he has never used smokeless tobacco. He reports that he does not drink alcohol or use drugs. He  has no sexual activity history on file. The patient  has a past surgical history that includes Prostate biopsy.  His family history includes Bladder Cancer in his father; Pancreatic cancer in his mother.  Review of Systems  Constitutional: Negative for chills and fever.  Skin: Negative for itching and rash.  Neurological: Negative for dizziness.  Endo/Heme/Allergies: Does not bruise/bleed easily.    The problem list and medications were reviewed and updated by myself where necessary and exist elsewhere in the encounter.   OBJECTIVE:  BP (!) 141/97   Pulse 77   Temp 98.6 F (37 C) (Oral)   Resp 16   Ht 5\' 10"  (1.778 m)   Wt 220 lb (99.8 kg)   SpO2 94%   BMI 31.57 kg/m   Physical Exam  Constitutional: He appears well-developed. He is active and cooperative.  Non-toxic appearance.  Cardiovascular: Normal rate.  Pulmonary/Chest: Effort normal. No tachypnea.  Musculoskeletal:       Hands: Neurological: He is alert.  Skin: Skin is warm and dry. He is not diaphoretic. No pallor.  Vitals reviewed.  Risk and benefits discussed and verbal consent obtained. Anesthetic allergies reviewed. Patient anesthetized using 1:1 mix of 2% lidocaine without epi and  Marcaine. The wound was cleansed thoroughly with soap and water. Sterile prep and drape. Wound closed with 3 HM throws using 4-0 Ethilon suture material. Hemostasis achieved. Mupirocin applied to the wound and bandage placed. The patient tolerated well. Wound instructions were provided and the patient is to return in 10 days for suture removal.   No results found for this or any previous visit (from the past 72 hour(s)).  No results found.  ASSESSMENT AND PLAN:  Stanly was seen today for laceration and immunizations.  Diagnoses and all orders for this visit:  Laceration of left index finger without foreign body without damage to nail, initial encounter: Repaired without difficulty. He will RTC in 10 days for removal, sooner if signs of infection  -     Tdap vaccine greater than or equal to 7yo IM  Other orders -     Cancel: Td vaccine greater than or equal to 7yo preservative free IM    The patient is advised to call or return to clinic if he does not see an improvement in symptoms, or to seek the care of the closest emergency department if he worsens with the above plan.   Philis Fendt, MHS, PA-C [facility] at [facility] 01/31/2017 10:40 AM

## 2017-02-02 ENCOUNTER — Ambulatory Visit: Payer: 59 | Admitting: Physician Assistant

## 2017-02-11 ENCOUNTER — Ambulatory Visit (INDEPENDENT_AMBULATORY_CARE_PROVIDER_SITE_OTHER): Payer: 59 | Admitting: Physician Assistant

## 2017-02-11 ENCOUNTER — Encounter: Payer: Self-pay | Admitting: Physician Assistant

## 2017-02-11 ENCOUNTER — Other Ambulatory Visit: Payer: Self-pay

## 2017-02-11 VITALS — HR 76 | Resp 16 | Ht 70.0 in | Wt 219.0 lb

## 2017-02-11 DIAGNOSIS — Z4802 Encounter for removal of sutures: Secondary | ICD-10-CM

## 2017-02-11 NOTE — Progress Notes (Signed)
    02/11/2017 12:03 PM   DOB: 01/07/1956 / MRN: [id number]  SUBJECTIVE:  Charles [name] is a 62 y.o. male presenting for suture removal.  Denies any problems with the sutures or the wound.   He is allergic to efavirenz and emtricitabine-tenofovir df.   He  has a past medical history of Arthritis, HIV positive ([place]), and Personal history of colonic adenoma (02/24/2013).    He  reports that  has never smoked. he has never used smokeless tobacco. He reports that he does not drink alcohol or use drugs. He  has no sexual activity history on file. The patient  has a past surgical history that includes Prostate biopsy.  His family history includes Bladder Cancer in his father; Pancreatic cancer in his mother.  ROS  The problem list and medications were reviewed and updated by myself where necessary and exist elsewhere in the encounter.   OBJECTIVE:  Pulse 76   Resp 16   Ht 5\' 10"  (1.778 m)   Wt 219 lb (99.3 kg)   SpO2 94%   BMI 31.42 kg/m   Physical Exam  Skin:       No results found for this or any previous visit (from the past 72 hour(s)).  No results found.  ASSESSMENT AND PLAN:  [name] was seen today for follow-up.  Diagnoses and all orders for this visit:  Visit for suture removal    The patient is advised to call or return to clinic if he does not see an improvement in symptoms, or to seek the care of the closest emergency department if he worsens with the above plan.   Philis Fendt, MHS, PA-C [facility] at [facility] 02/11/2017 12:03 PM

## 2017-03-09 ENCOUNTER — Ambulatory Visit (INDEPENDENT_AMBULATORY_CARE_PROVIDER_SITE_OTHER): Payer: 59

## 2017-03-09 ENCOUNTER — Other Ambulatory Visit: Payer: Self-pay

## 2017-03-09 ENCOUNTER — Ambulatory Visit (INDEPENDENT_AMBULATORY_CARE_PROVIDER_SITE_OTHER): Payer: 59 | Admitting: Family Medicine

## 2017-03-09 ENCOUNTER — Encounter: Payer: Self-pay | Admitting: Family Medicine

## 2017-03-09 VITALS — BP 116/82 | HR 75 | Temp 98.2°F | Resp 18 | Ht 70.0 in | Wt 214.4 lb

## 2017-03-09 DIAGNOSIS — R059 Cough, unspecified: Secondary | ICD-10-CM

## 2017-03-09 DIAGNOSIS — R509 Fever, unspecified: Secondary | ICD-10-CM | POA: Diagnosis not present

## 2017-03-09 DIAGNOSIS — R05 Cough: Secondary | ICD-10-CM

## 2017-03-09 LAB — POCT CBC
Granulocyte percent: 68.1 %G (ref 37–80)
HCT, POC: 43.8 % (ref 43.5–53.7)
Hemoglobin: 15 g/dL (ref 14.1–18.1)
Lymph, poc: 1.4 (ref 0.6–3.4)
MCH: 33.6 pg — AB (ref 27–31.2)
MCHC: 34.3 g/dL (ref 31.8–35.4)
MCV: 98 fL — AB (ref 80–97)
MID (CBC): 0.5 (ref 0–0.9)
MPV: 5.9 fL (ref 0–99.8)
PLATELET COUNT, POC: 220 10*3/uL (ref 142–424)
POC Granulocyte: 4.2 (ref 2–6.9)
POC LYMPH %: 23.2 % (ref 10–50)
POC MID %: 8.7 %M (ref 0–12)
RBC: 4.47 M/uL — AB (ref 4.69–6.13)
RDW, POC: 12.1 %
WBC: 6.2 10*3/uL (ref 4.6–10.2)

## 2017-03-09 LAB — POC INFLUENZA A&B (BINAX/QUICKVUE)
INFLUENZA A, POC: NEGATIVE
INFLUENZA B, POC: NEGATIVE

## 2017-03-09 MED ORDER — AZITHROMYCIN 250 MG PO TABS: ORAL_TABLET | ORAL | 0 refills | Status: DC

## 2017-03-09 NOTE — Progress Notes (Signed)
Subjective:  By signing my name below, I, Essence Howell, attest that this documentation has been prepared under the direction and in the presence of Wendie Agreste, MD Electronically Signed: Ladene Artist, ED Scribe 03/09/2017 at 12:31 PM.   Patient ID: Charles [name], male    DOB: 03-Jan-1956, 62 y.o.   MRN: [id number]  Chief Complaint  Patient presents with  . Fever    since wednesday and is worse in the evening gets up to 101.7 pt is taking tylenol   . Cough    white mucus x2weeks  . Eye Pain    very little eye pain    HPI Charles [name] is a 62 y.o. male who presents to Primary Care at [facility] complaining of subjective fever x 5 days, measured fever x 2 days and productive cough with white mucus x 1.5 wks. Pt reports associated symptoms of chills x 5 days, night sweats, clear rhinorrhea x 1.5 wks, minimal eye pain. He has taken Tylenol with the last dose being 9 PM last night with a fever of 101.7 F. He did receive a flu vaccine in Oct. Denies blurry vision, rash. H/o HIV. Last CD4 6.12 with viral load less than 20 on 11/28. Followed by [facility].  Patient Active Problem List   Diagnosis Date Noted  . Personal history of colonic adenoma 02/24/2013  . HIV (human immunodeficiency virus infection) ([place]) 05/01/2011   Past Medical History:  Diagnosis Date  . Arthritis   . HIV positive ([place])   . Personal history of colonic adenoma 02/24/2013   02/24/2013 diminutive sigmoid polyp - tubular adenoma - repeat colon 2020     Past Surgical History:  Procedure Laterality Date  . PROSTATE BIOPSY     Allergies  Allergen Reactions  . Efavirenz Rash  . Emtricitabine-Tenofovir Df Rash   Prior to Admission medications   Medication Sig Start Date End Date Taking? Authorizing Provider  Abacavir-Dolutegravir-Lamivud (TRIUMEQ) 825-05-397 MG TABS Take by mouth.    [provider]  valACYclovir (VALTREX) 1000 MG tablet Take 1 tablet (1,000 mg total) by mouth  daily. For 5 days per occurrence. 11/03/16   Wendie Agreste, MD   Social History   Socioeconomic History  . Marital status: Married    Spouse name: Not on file  . Number of children: Not on file  . Years of education: Not on file  . Highest education level: Not on file  Social Needs  . Financial resource strain: Not on file  . Food insecurity - worry: Not on file  . Food insecurity - inability: Not on file  . Transportation needs - medical: Not on file  . Transportation needs - non-medical: Not on file  Occupational History  . Not on file  Tobacco Use  . Smoking status: Never Smoker  . Smokeless tobacco: Never Used  Substance and Sexual Activity  . Alcohol use: No  . Drug use: No  . Sexual activity: Not on file  Other Topics Concern  . Not on file  Social History Narrative  . Not on file   Review of Systems  Constitutional: Positive for chills, diaphoresis and fever.  HENT: Positive for rhinorrhea.   Eyes: Positive for pain. Negative for visual disturbance.  Respiratory: Positive for cough.   Skin: Negative for rash.      Objective:   Physical Exam  Constitutional: He is oriented to person, place, and time. He appears well-developed and well-nourished.  HENT:  Head: Normocephalic and  atraumatic.  Right Ear: Tympanic membrane, external ear and ear canal normal.  Left Ear: Tympanic membrane, external ear and ear canal normal.  Nose: No rhinorrhea.  Mouth/Throat: Oropharynx is clear and moist and mucous membranes are normal. No oropharyngeal exudate or posterior oropharyngeal erythema.  Eyes: Conjunctivae are normal. Pupils are equal, round, and reactive to light.  Neck: Neck supple.  Cardiovascular: Normal rate, regular rhythm, normal heart sounds and intact distal pulses.  No murmur heard. Pulmonary/Chest: Effort normal and breath sounds normal. He has no wheezes. He has no rhonchi. He has no rales.  Abdominal: Soft. There is no tenderness.  Lymphadenopathy:     He has no cervical adenopathy.  Neurological: He is alert and oriented to person, place, and time.  Skin: Skin is warm and dry. No rash noted.  Psychiatric: He has a normal mood and affect. His behavior is normal.  Vitals reviewed.  Vitals:   03/09/17 1156  BP: 116/82  Pulse: 75  Resp: 18  Temp: 98.2 F (36.8 C)  TempSrc: Oral  SpO2: 95%  Weight: 214 lb 6.4 oz (97.3 kg)  Height: 5\' 10"  (1.778 m)      Assessment & Plan:   Charles [name] is a 62 y.o. male Fever, unspecified - Plan: DG Chest 2 View, POC Influenza A&B(BINAX/QUICKVUE), POCT CBC, azithromycin (ZITHROMAX) 250 MG tablet  Cough - Plan: DG Chest 2 View, POC Influenza A&B(BINAX/QUICKVUE), POCT CBC, azithromycin (ZITHROMAX) 250 MG tablet Few week history of cough, accompanied by fever recently. Afebrile at present. Possible secondary viral illness, possible false-negative flu testing. Reassuring CBC, flu test, chest x-ray, and exam currently.  -Continue symptomatic care, but if cough persists, or fever returns, can start azithromycin  -Most recent CD4, viral load looked okay, but if fevers persist would recommend evaluation with his infectious disease specialist, or RTC precautions.  Meds ordered this encounter  Medications  . azithromycin (ZITHROMAX) 250 MG tablet    Sig: Take 2 pills by mouth on day 1, then 1 pill by mouth per day on days 2 through 5.    Dispense:  6 tablet    Refill:  0   Patient Instructions     IF you received an x-ray today, you will receive an invoice from [facility]. Please contact [facility] at [phone] with questions or concerns regarding your invoice.   IF you received labwork today, you will receive an invoice from [facility]. Please contact [facility] at [phone] with questions or concerns regarding your invoice.   Our billing staff will not be able to assist you with questions regarding bills from these companies.  You will be contacted with the lab results as  soon as they are available. The fastest way to get your results is to activate your My Chart account. Instructions are located on the last page of this paperwork. If you have not heard from [place] regarding the results in 2 weeks, please contact this office.      I personally performed the services described in this documentation, which was scribed in my presence. The recorded information has been reviewed and considered for accuracy and completeness, addended by me as needed, and agree with information above.  Signed,   Merri Ray, MD [facility] at [facility].  03/09/17 1:00 PM

## 2017-03-09 NOTE — Patient Instructions (Addendum)
Flu testing, blood counts, chest x-ray were overall reassuring. You may have had a viral syndrome recently such as flu, but if fevers are not continuing to improve, and cough is not continuing to improve this week, I did write for azithromycin.  Mucinex as needed for cough.  If fevers persist, recommend follow-up here or with your infectious disease specialist to determine if other testing as needed.  Return to the clinic or go to the nearest emergency room if any of your symptoms worsen or new symptoms occur.   Cough, Adult Coughing is a reflex that clears your throat and your airways. Coughing helps to heal and protect your lungs. It is normal to cough occasionally, but a cough that happens with other symptoms or lasts a long time may be a sign of a condition that needs treatment. A cough may last only 2-3 weeks (acute), or it may last longer than 8 weeks (chronic). What are the causes? Coughing is commonly caused by:  Breathing in substances that irritate your lungs.  A viral or bacterial respiratory infection.  Allergies.  Asthma.  Postnasal drip.  Smoking.  Acid backing up from the stomach into the esophagus (gastroesophageal reflux).  Certain medicines.  Chronic lung problems, including COPD (or rarely, lung cancer).  Other medical conditions such as heart failure.  Follow these instructions at home: Pay attention to any changes in your symptoms. Take these actions to help with your discomfort:  Take medicines only as told by your health care provider. ? If you were prescribed an antibiotic medicine, take it as told by your health care provider. Do not stop taking the antibiotic even if you start to feel better. ? Talk with your health care provider before you take a cough suppressant medicine.  Drink enough fluid to keep your urine clear or pale yellow.  If the air is dry, use a cold steam vaporizer or humidifier in your bedroom or your home to help loosen  secretions.  Avoid anything that causes you to cough at work or at home.  If your cough is worse at night, try sleeping in a semi-upright position.  Avoid cigarette smoke. If you smoke, quit smoking. If you need help quitting, ask your health care provider.  Avoid caffeine.  Avoid alcohol.  Rest as needed.  Contact a health care provider if:  You have new symptoms.  You cough up pus.  Your cough does not get better after 2-3 weeks, or your cough gets worse.  You cannot control your cough with suppressant medicines and you are losing sleep.  You develop pain that is getting worse or pain that is not controlled with pain medicines.  You have a fever.  You have unexplained weight loss.  You have night sweats. Get help right away if:  You cough up blood.  You have difficulty breathing.  Your heartbeat is very fast. This information is not intended to replace advice given to you by your health care provider. Make sure you discuss any questions you have with your health care provider. Document Released: 07/26/2010 Document Revised: 07/05/2015 Document Reviewed: 04/05/2014 Elsevier Interactive Patient Education  2018 [employer]. Fever, Adult A fever is an increase in the body's temperature. It is usually defined as a temperature of 100F (38C) or higher. Brief mild or moderate fevers generally have no long-term effects, and they often do not require treatment. Moderate or high fevers may make you feel uncomfortable and can sometimes be a sign of a serious illness or  disease. The sweating that may occur with repeated or prolonged fever may also cause dehydration. Fever is confirmed by taking a temperature with a thermometer. A measured temperature can vary with:  Age.  Time of day.  Location of the thermometer: ? Mouth (oral). ? Rectum (rectal). ? Ear (tympanic). ? Underarm (axillary). ? Forehead (temporal).  Follow these instructions at home: Pay attention to any  changes in your symptoms. Take these actions to help with your condition:  Take over-the counter and prescription medicines only as told by your health care provider. Follow the dosing instructions carefully.  If you were prescribed an antibiotic medicine, take it as told by your health care provider. Do not stop taking the antibiotic even if you start to feel better.  Rest as needed.  Drink enough fluid to keep your urine clear or pale yellow. This helps to prevent dehydration.  Sponge yourself or bathe with room-temperature water to help reduce your body temperature as needed. Do not use ice water.  Do not overbundle yourself in blankets or heavy clothes.  Contact a health care provider if:  You vomit.  You cannot eat or drink without vomiting.  You have diarrhea.  You have pain when you urinate.  Your symptoms do not improve with treatment.  You develop new symptoms.  You develop excessive weakness. Get help right away if:  You have shortness of breath or have trouble breathing.  You are dizzy or you faint.  You are disoriented or confused.  You develop signs of dehydration, such as a dry mouth, decreased urination, or paleness.  You develop severe pain in your abdomen.  You have persistent vomiting or diarrhea.  You develop a skin rash.  Your symptoms suddenly get worse. This information is not intended to replace advice given to you by your health care provider. Make sure you discuss any questions you have with your health care provider. Document Released: 07/23/2000 Document Revised: 07/05/2015 Document Reviewed: 03/23/2014 Elsevier Interactive Patient Education  2018 [employer].   IF you received an x-ray today, you will receive an invoice from [facility]. Please contact [facility] at [phone] with questions or concerns regarding your invoice.   IF you received labwork today, you will receive an invoice from [place]. Please  contact [facility] at [phone] with questions or concerns regarding your invoice.   Our billing staff will not be able to assist you with questions regarding bills from these companies.  You will be contacted with the lab results as soon as they are available. The fastest way to get your results is to activate your My Chart account. Instructions are located on the last page of this paperwork. If you have not heard from [place] regarding the results in 2 weeks, please contact this office.

## 2017-07-30 DIAGNOSIS — Z1159 Encounter for screening for other viral diseases: Secondary | ICD-10-CM | POA: Diagnosis not present

## 2018-01-16 DIAGNOSIS — Z23 Encounter for immunization: Secondary | ICD-10-CM | POA: Diagnosis not present

## 2018-02-25 ENCOUNTER — Encounter: Payer: Self-pay | Admitting: Physician Assistant

## 2018-02-25 ENCOUNTER — Ambulatory Visit (INDEPENDENT_AMBULATORY_CARE_PROVIDER_SITE_OTHER): Payer: 59 | Admitting: Physician Assistant

## 2018-02-25 VITALS — BP 120/70 | HR 78 | Ht 70.0 in | Wt 222.0 lb

## 2018-02-25 DIAGNOSIS — K602 Anal fissure, unspecified: Secondary | ICD-10-CM | POA: Diagnosis not present

## 2018-02-25 DIAGNOSIS — Z86010 Personal history of colonic polyps: Secondary | ICD-10-CM

## 2018-02-25 DIAGNOSIS — K625 Hemorrhage of anus and rectum: Secondary | ICD-10-CM

## 2018-02-25 MED ORDER — NITROGLYCERIN 0.4 % RE OINT: TOPICAL_OINTMENT | RECTAL | 1 refills | Status: DC

## 2018-02-25 NOTE — Progress Notes (Addendum)
Chief Complaint: Rectal pain and bleeding and constipation  HPI:    Charles [name] is a 63 year old Caucasian male with a past medical history as listed below, known to Dr. Carlean Purl, who was referred to me by Wendie Agreste, MD for a complaint of rectal pain and bleeding with constipation.      02/24/2013 colonoscopy with a single polyp measuring 5 mm in the sigmoid colon and otherwise normal.  Pathology showed 5 mm tubular adenoma, repeat colonoscopy recommended in 2020.    Today, the patient describes that for the past 2 weeks he has some level of rectal discomfort with a bowel movement.  Tells me that typically he wakes up in the morning and this pain is not that bad but around 6 PM at night this seems to get worse.  Whenever he passes a stool it is also worse, over the past few days has become more of a pain than a discomfort.  Along with this patient did experience a week of constipation when trying to add fiber into his diet.  Around that time experienced a "belt line pain" but that has gotten some better.  Tells me this all started with initially "persistent gas about every 15 minutes".  This has since gone away.  Also describes seeing some "pink on the toilet paper when wiping" occasionally.    Denies fever, chills, weight loss, abdominal pain or symptoms that awaken him from sleep.  Past Medical History:  Diagnosis Date  . Arthritis   . HIV positive ([place])   . Personal history of colonic adenoma 02/24/2013   02/24/2013 diminutive sigmoid polyp - tubular adenoma - repeat colon 2020      Past Surgical History:  Procedure Laterality Date  . COLONOSCOPY    . PROSTATE BIOPSY      Current Outpatient Medications  Medication Sig Dispense Refill  . Abacavir-Dolutegravir-Lamivud (TRIUMEQ) [phone] MG TABS Take by mouth.    . valACYclovir (VALTREX) 1000 MG tablet Take 1 tablet (1,000 mg total) by mouth daily. For 5 days per occurrence. (Patient taking differently: Take 1,000 mg by mouth daily.  For 5 days per occurrence.  Uses prn) 30 tablet 0   No current facility-administered medications for this visit.     Allergies as of 02/25/2018 - Review Complete 02/25/2018  Allergen Reaction Noted  . Efavirenz Rash   . Emtricitabine-tenofovir df Rash     Family History  Problem Relation Age of Onset  . Pancreatic cancer Mother   . Bladder Cancer Father        smoker in his early days-cigar  . Kidney disease Father        on dyalsis  . Prostate cancer Father        had seed implant  . Colon cancer Neg Hx   . Esophageal cancer Neg Hx   . Stomach cancer Neg Hx   . Rectal cancer Neg Hx     Social History   Socioeconomic History  . Marital status: Married    Spouse name: Not on file  . Number of children: 0  . Years of education: Not on file  . Highest education level: Not on file  Occupational History  . Occupation: Chief Executive Officer  Social Needs  . Financial resource strain: Not on file  . Food insecurity:    Worry: Not on file    Inability: Not on file  . Transportation needs:    Medical: Not on file    Non-medical: Not on file  Tobacco  Use  . Smoking status: Never Smoker  . Smokeless tobacco: Never Used  Substance and Sexual Activity  . Alcohol use: No  . Drug use: No  . Sexual activity: Not on file  Lifestyle  . Physical activity:    Days per week: Not on file    Minutes per session: Not on file  . Stress: Not on file  Relationships  . Social connections:    Talks on phone: Not on file    Gets together: Not on file    Attends religious service: Not on file    Active member of club or organization: Not on file    Attends meetings of clubs or organizations: Not on file    Relationship status: Not on file  . Intimate partner violence:    Fear of current or ex partner: Not on file    Emotionally abused: Not on file    Physically abused: Not on file    Forced sexual activity: Not on file  Other Topics Concern  . Not on file  Social History Narrative  . Not on  file    Review of Systems:    Constitutional: No weight loss, fever or chills Skin: No rash  Cardiovascular: No chest pain Respiratory: No SOB Gastrointestinal: See HPI and otherwise negative Genitourinary: No dysuria  Neurological: No headache, dizziness or syncope Musculoskeletal: No new muscle or joint pain Hematologic: No bruising Psychiatric: No history of depression or anxiety   Physical Exam:  Vital signs: BP 120/70   Pulse 78   Ht 5\' 10"  (1.778 m)   Wt 222 lb (100.7 kg)   BMI 31.85 kg/m   Constitutional:   Pleasant Caucasian male appears to be in NAD, Well developed, Well nourished, alert and cooperative Head:  Normocephalic and atraumatic. Eyes:   PEERL, EOMI. No icterus. Conjunctiva pink. Ears:  Normal auditory acuity. Neck:  Supple Throat: Oral cavity and pharynx without inflammation, swelling or lesion.  Respiratory: Respirations even and unlabored. Lungs clear to auscultation bilaterally.   No wheezes, crackles, or rhonchi.  Cardiovascular: Normal S1, S2. No MRG. Regular rate and rhythm. No peripheral edema, cyanosis or pallor.  Gastrointestinal:  Soft, nondistended, nontender. No rebound or guarding. Normal bowel sounds. No appreciable masses or hepatomegaly. Rectal:  External: posterior fissure, ttp; Internal: no mass, no residue Msk:  Symmetrical without gross deformities. Without edema, no deformity or joint abnormality.  Neurologic:  Alert and  oriented x4;  grossly normal neurologically.  Skin:   Dry and intact without significant lesions or rashes. Psychiatric: Demonstrates good judgement and reason without abnormal affect or behaviors.  MOST RECENT LABS AND IMAGING: CBC    Component Value Date/Time   WBC 6.2 03/09/2017 1249   RBC 4.47 (A) 03/09/2017 1249   HGB 15.0 03/09/2017 1249   HCT 43.8 03/09/2017 1249   MCV 98.0 (A) 03/09/2017 1249   MCH 33.6 (A) 03/09/2017 1249   MCHC 34.3 03/09/2017 1249   Assessment: 1.  Anal fissure: On exam today with  rectal pain and bleeding per patient, tender to palpation 2.  History of adenomatous polyps: Last colon in 2015, recommendations for repeat in 5 years, patient is due now  Plan: 1.  Prescribed Nitroglycerin ointment 0.125% twice daily x 6 to 8 weeks.  Applied rectally with a gloved finger. 2.  Would recommend sitz bath for 15 to 20 minutes 2-3 times a day. 3.  Patient can buy over-the-counter RectiCare cream with lidocaine, given a coupon, and apply as needed for  pain 4.  Patient is due for surveillance colonoscopy given his history of adenomatous polyps.  He would like an 830 appointment and there are none available at this time.  He is going to call back when the March schedule opens up for colonoscopy with Dr. Carlean Purl in the [facility].  Did go ahead and discuss risks, benefits, limitations and alternatives and patient agrees to proceed. 5.  Discussed with patient that underlying he needs to try and avoid constipation/straining as this will make his problems worse.  Encouraged him to increase his water intake. 6.  Patient will return to clinic to follow with Dr. Carlean Purl after time of procedure or before if necessary.  Ellouise Newer, PA-C [facility] 02/25/2018, 8:30 AM  Cc: Wendie Agreste, MD   Agree with Ms. Lemmon's evaluation and management.  Gatha Mayer, MD, [facility]

## 2018-02-25 NOTE — Patient Instructions (Addendum)
We sent a prescription for Nitroglycerin ointment to [place].  You can get Recticare with lidocaine over the counter there. We have given you a coupon.  Take sitz baths- a warm to hot bath for 15 to 20 min 2-3 times daily. It has been recommended to you by your physician that you have a(n) colonoscopy completed. Per your request, we did not schedule the procedure(s) today. Please contact our office at [phone] should you decide to have the procedure completed.

## 2018-03-23 ENCOUNTER — Telehealth: Payer: Self-pay | Admitting: Physician Assistant

## 2018-03-23 ENCOUNTER — Telehealth: Payer: Self-pay

## 2018-03-23 NOTE — Telephone Encounter (Signed)
error 

## 2018-03-23 NOTE — Telephone Encounter (Signed)
The pt was advised to go to the ED.  The pt has been advised of the information and verbalized understanding.

## 2018-03-23 NOTE — Telephone Encounter (Signed)
He needs to go to the ER if it has worsened that much and no one can see him today. Thanks-JLL

## 2018-03-23 NOTE — Telephone Encounter (Signed)
The pt walked in and states he has worse rectal bleeding.  It is bright red and dripped onto the floor and in the toilet.  His underwear was also bloody.  He says that in the morning he has a BM that is soft and has minimal blood but by the evening he feels as if he has to have a BM and when he goes to sit blood will drip and turn the water red.  Has some discomfort in the rectal area and some discomfort around his back and lower abd. He states he uses the nitro but seems that makes it bleed worse.  The front office scheduled him an appt today to see Nevin Bloodgood on 2-19.  He is anxious and does not think he can wait until then.  Please advise what he needs to do in the mean time.

## 2018-03-23 NOTE — Telephone Encounter (Signed)
Pt reports that his rectal bleeding has gotten worse.  Pt is sitting here now.

## 2018-03-31 ENCOUNTER — Ambulatory Visit (INDEPENDENT_AMBULATORY_CARE_PROVIDER_SITE_OTHER): Payer: 59 | Admitting: Nurse Practitioner

## 2018-03-31 ENCOUNTER — Encounter (INDEPENDENT_AMBULATORY_CARE_PROVIDER_SITE_OTHER): Payer: Self-pay

## 2018-03-31 ENCOUNTER — Encounter: Payer: Self-pay | Admitting: Nurse Practitioner

## 2018-03-31 VITALS — BP 120/86 | HR 73 | Ht 70.0 in | Wt 218.0 lb

## 2018-03-31 DIAGNOSIS — Z86010 Personal history of colonic polyps: Secondary | ICD-10-CM

## 2018-03-31 DIAGNOSIS — K602 Anal fissure, unspecified: Secondary | ICD-10-CM

## 2018-03-31 NOTE — Progress Notes (Addendum)
Chief Complaint:    Rectal bleeding  IMPRESSION and PLAN:    57.  63 year old male diagnosed with anal fissure in late January.  He was prescribed NTG ointment twice daily though hasn't been using it on a regular basis.  He is here with persistent rectal bleeding.  The bleeding waxes and wanes.  He is definitely interested in some reassurance that there is nothing else going on. He has only minor rectal discomfort at times -Reassurance provided.  He has a fissure, not completely healed.  Some of the ongoing bleeding could be from internal hemorrhoids, I did not confirm with anoscopy since patient needs a colonoscopy soon anyway -Advised patient to continue nitroglycerin ointment twice daily but needs to use it every day for a couple more weeks.   He needs to be diligent in using this as directed.   I reviewed application instructions.  -Avoid straining/constipation.  This has not been an issue -Avoid anal intercourse until we know that fissure has healed -Further evaluation at time of colonoscopy.   2.  History of adenomatous colon polyps, patient is due for surveillance colonoscopy. -The risks and benefits of colonoscopy with possible polypectomy / biopsies were discussed and the patient agrees to proceed.    Agree with Charles [name]'s assessment and plan. Charles Mayer, MD, Charles [name]    HPI:     Patient is a 63 year old male with HIV known to Charles [name] who was seen by Charles [name], P.A. on 02/25/2018 for evaluation of rectal pain and bleeding in the setting of constipation.  Anal fissure was found on exam , he was prescribed nitroglycerin ointment twice daily for 6 to 8 weeks.  Patient walked into the office 03/23/18  with complaints of going rectal bleeding.  We advised him to go to the ED if bleeding had gotten worse.  Patient ended up not going to the ED, he was given an appointment for today.   The rectal bleeding has waxed and waned over the last few days.  He has some mild  rectal discomfort, never had anything to severe.  He really has not needed to use the RectiCare.  He has not been using the nitroglycerin ointment on a regular basis lately.  He is definitely not constipated, not straining.  Patient is homosexual but has not recently been participating in any anal intercourse over the last few weeks.  He has no abdominal pain, no other GI symptoms.  Several days ago he did have flulike symptoms which resolved after 3 days.   Patient has a history of adenomatous colon polyps, he is due for surveillance colonoscopy.  Plan was for patient to call back to schedule colonoscopy but this has not been done  Review of systems:     No chest pain, no SOB, no fevers, no urinary sx   Past Medical History:  Diagnosis Date  . Arthritis   . HIV positive (Charles [name])   . Personal history of colonic adenoma 02/24/2013   02/24/2013 diminutive sigmoid polyp - tubular adenoma - repeat colon 2020      Patient's surgical history, family medical history, social history, medications and allergies were all reviewed in Epic   Creatinine clearance cannot be calculated (Patient's most recent lab result is older than the maximum 21 days allowed.)  Current Outpatient Medications  Medication Sig Dispense Refill  . Abacavir-Dolutegravir-Lamivud (TRIUMEQ) [phone] MG TABS Take by mouth.    . Nitroglycerin 0.4 % OINT Use a pea size amount  rectally twice daily for 6-8 weeks. 30 g 1  . valACYclovir (VALTREX) 1000 MG tablet Take 1 tablet (1,000 mg total) by mouth daily. For 5 days per occurrence. (Patient taking differently: Take 1,000 mg by mouth daily. For 5 days per occurrence.  Uses prn) 30 tablet 0   No current facility-administered medications for this visit.     Physical Exam:     BP 120/86   Pulse 73   Ht 5\' 10"  (1.778 m)   Wt 218 lb (98.9 kg)   BMI 31.28 kg/m   GENERAL:  Pleasant male in NAD PSYCH: : Cooperative, normal affect EENT:  conjunctiva pink, mucous membranes moist, neck  supple without masses CARDIAC:  RRR, no murmur heard, no peripheral edema ABDOMEN:  Nondistended, soft, nontender. No obvious masses, no hepatomegaly,  normal bowel sounds RECTAL: Posterior midline fissure.  On DRE he had mild discomfort in the posterior midline area where the tissue was also thickened Musculoskeletal:  Normal muscle tone, normal strength NEURO: Alert and oriented x 3, no focal neurologic deficits   Charles [name] , NP 03/31/2018, 10:13 AM

## 2018-03-31 NOTE — Patient Instructions (Signed)
You have been scheduled for a colonoscopy. Please follow written instructions given to you at your visit today.  Please pick up your prep supplies at the pharmacy within the next 1-3 days. If you use inhalers (even only as needed), please bring them with you on the day of your procedure. Your physician has requested that you go to www.startemmi.com and enter the access code given to you at your visit today. This web site gives a general overview about your procedure. However, you should still follow specific instructions given to you by our office regarding your preparation for the procedure.  Continue Nitroglycerin as prescribed.

## 2018-04-22 ENCOUNTER — Encounter: Payer: 59 | Admitting: Internal Medicine

## 2018-04-23 ENCOUNTER — Encounter: Payer: Self-pay | Admitting: Internal Medicine

## 2018-04-23 ENCOUNTER — Ambulatory Visit (AMBULATORY_SURGERY_CENTER): Payer: 59 | Admitting: Internal Medicine

## 2018-04-23 ENCOUNTER — Other Ambulatory Visit: Payer: Self-pay

## 2018-04-23 VITALS — BP 110/80 | HR 66 | Temp 99.1°F | Resp 12 | Ht 70.0 in | Wt 218.0 lb

## 2018-04-23 DIAGNOSIS — Z1211 Encounter for screening for malignant neoplasm of colon: Secondary | ICD-10-CM | POA: Diagnosis not present

## 2018-04-23 DIAGNOSIS — K635 Polyp of colon: Secondary | ICD-10-CM

## 2018-04-23 DIAGNOSIS — Z860100 Personal history of colon polyps, unspecified: Secondary | ICD-10-CM

## 2018-04-23 DIAGNOSIS — D120 Benign neoplasm of cecum: Secondary | ICD-10-CM | POA: Diagnosis not present

## 2018-04-23 MED ORDER — SODIUM CHLORIDE 0.9 % IV SOLN: 500.0000 mL | Freq: Once | INTRAVENOUS | Status: DC

## 2018-04-23 NOTE — Patient Instructions (Addendum)
I found and removed one polyp.  The fissure appears to be healing.  It is often necessary to take the nitroglycerin for 1 month after no symptoms.  The bleeding you see now may be from hemorrhoids.  None of this is dangerous.  I will let you know pathology results and when to have another routine colonoscopy by mail and/or My Chart.  I appreciate the opportunity to care for you. Gatha Mayer, MD, FACG  YOU HAD AN ENDOSCOPIC PROCEDURE TODAY AT [facility]:   Refer to the procedure report that was given to you for any specific questions about what was found during the examination.  If the procedure report does not answer your questions, please call your gastroenterologist to clarify.  If you requested that your care partner not be given the details of your procedure findings, then the procedure report has been included in a sealed envelope for you to review at your convenience later.  YOU SHOULD EXPECT: Some feelings of bloating in the abdomen. Passage of more gas than usual.  Walking can help get rid of the air that was put into your GI tract during the procedure and reduce the bloating. If you had a lower endoscopy (such as a colonoscopy or flexible sigmoidoscopy) you may notice spotting of blood in your stool or on the toilet paper. If you underwent a bowel prep for your procedure, you may not have a normal bowel movement for a few days.  Please Note:  You might notice some irritation and congestion in your nose or some drainage.  This is from the oxygen used during your procedure.  There is no need for concern and it should clear up in a day or so.  SYMPTOMS TO REPORT IMMEDIATELY:   Following lower endoscopy (colonoscopy or flexible sigmoidoscopy):  Excessive amounts of blood in the stool  Significant tenderness or worsening of abdominal pains  Swelling of the abdomen that is new, acute  Fever of 100F or higher  For urgent or emergent issues, a  gastroenterologist can be reached at any hour by calling [phone].  DIET:  We do recommend a small meal at first, but then you may proceed to your regular diet.  Drink plenty of fluids but you should avoid alcoholic beverages for 24 hours.  ACTIVITY:  You should plan to take it easy for the rest of today and you should NOT DRIVE or use heavy machinery until tomorrow (because of the sedation medicines used during the test).    FOLLOW UP: Our staff will call the number listed on your records the next business day following your procedure to check on you and address any questions or concerns that you may have regarding the information given to you following your procedure. If we do not reach you, we will leave a message.  However, if you are feeling well and you are not experiencing any problems, there is no need to return our call.  We will assume that you have returned to your regular daily activities without incident.  If any biopsies were taken you will be contacted by phone or by letter within the next 1-3 weeks.  Please call us at [phone] if you have not heard about the biopsies in 3 weeks.   SIGNATURES/CONFIDENTIALITY: You and/or your care partner have signed paperwork which will be entered into your electronic medical record.  These signatures attest to the fact that that the information above on your After Visit Summary has  been reviewed and is understood.  Full responsibility of the confidentiality of this discharge information lies with you and/or your care-partner.  Await pathology  Please read over handouts about polyps and hemorrhoids   Continue your normal medications

## 2018-04-23 NOTE — Progress Notes (Signed)
Pt awake. VSS. Report given to RN. No anesthetic complications noted 

## 2018-04-23 NOTE — Progress Notes (Signed)
Called to room to assist during endoscopic procedure.  Patient ID and intended procedure confirmed with present staff. Received instructions for my participation in the procedure from the performing physician.  

## 2018-04-23 NOTE — Op Note (Signed)
[place] Patient Name: Charles [name] Procedure Date: 04/23/2018 10:06 AM MRN: [id number] Endoscopist: Gatha Mayer , MD Age: 63 Referring MD:  Date of Birth: 1955-04-01 Gender: Male Account #: [id number] Procedure:                Colonoscopy Indications:              Surveillance: Personal history of adenomatous                            polyps on last colonoscopy 5 years ago Medicines:                Propofol per Anesthesia, Monitored Anesthesia Care Procedure:                Pre-Anesthesia Assessment:                           - Prior to the procedure, a History and Physical                            was performed, and patient medications and                            allergies were reviewed. The patient's tolerance of                            previous anesthesia was also reviewed. The risks                            and benefits of the procedure and the sedation                            options and risks were discussed with the patient.                            All questions were answered, and informed consent                            was obtained. Prior Anticoagulants: The patient has                            taken no previous anticoagulant or antiplatelet                            agents. ASA Grade Assessment: II - A patient with                            mild systemic disease. After reviewing the risks                            and benefits, the patient was deemed in                            satisfactory condition to undergo the procedure.  After obtaining informed consent, the colonoscope                            was passed under direct vision. Throughout the                            procedure, the patient's blood pressure, pulse, and                            oxygen saturations were monitored continuously. The                            Colonoscope was introduced through the anus and   advanced to the the cecum, identified by                            appendiceal orifice and ileocecal valve. The                            colonoscopy was performed without difficulty. The                            patient tolerated the procedure well. The quality                            of the bowel preparation was good. The ileocecal                            valve, appendiceal orifice, and rectum were                            photographed. The bowel preparation used was                            Miralax. Scope In: 10:29:04 AM Scope Out: 10:43:18 AM Scope Withdrawal Time: 0 hours 11 minutes 21 seconds  Total Procedure Duration: 0 hours 14 minutes 14 seconds  Findings:                 The perianal and digital rectal examinations were                            normal. Pertinent negatives include normal prostate                            (size, shape, and consistency).                           A diminutive polyp was found in the cecum. The                            polyp was sessile. The polyp was removed with a                            cold snare. Resection and retrieval were complete.  Verification of patient identification for the                            specimen was done. Estimated blood loss was minimal.                           External and internal hemorrhoids were found.                           The exam was otherwise without abnormality on                            direct and retroflexion views. Complications:            No immediate complications. Estimated Blood Loss:     Estimated blood loss was minimal. Impression:               - One diminutive polyp in the cecum, removed with a                            cold snare. Resected and retrieved.                           - External and internal hemorrhoids. Probably                            causing bleeding now.                           - The examination was otherwise normal on  direct                            and retroflexion views. Recommendation:           - Patient has a contact number available for                            emergencies. The signs and symptoms of potential                            delayed complications were discussed with the                            patient. Return to normal activities tomorrow.                            Written discharge instructions were provided to the                            patient.                           - Resume previous diet.                           - Continue present medications.                           -  Repeat colonoscopy is recommended. The                            colonoscopy date will be determined after pathology                            results from today's exam become available for                            review.                           - If need to resume NTG for anal fissure take for 1                            month after feeling well. Gatha Mayer, MD 04/23/2018 10:56:01 AM This report has been signed electronically.

## 2018-04-26 ENCOUNTER — Telehealth: Payer: Self-pay

## 2018-04-26 NOTE — Telephone Encounter (Signed)
Pt called back and informed that he was doing well.

## 2018-04-26 NOTE — Telephone Encounter (Signed)
Called [phone] and left a messaged we tried to reach pt for a follow up call. maw

## 2018-04-30 ENCOUNTER — Encounter: Payer: Self-pay | Admitting: Internal Medicine

## 2018-04-30 NOTE — Progress Notes (Signed)
Diminutive ssp Recall 2027

## 2018-06-17 ENCOUNTER — Telehealth: Payer: Self-pay | Admitting: Internal Medicine

## 2018-06-17 NOTE — Telephone Encounter (Signed)
Left message on machine to call back  

## 2018-06-17 NOTE — Telephone Encounter (Signed)
Patient states his hemorrhoids have not gotten any better since his procedure on 3.13.20 and pt wants advice or to discuss next steps.

## 2018-06-18 NOTE — Telephone Encounter (Signed)
Patient returned phone call. °

## 2018-06-18 NOTE — Telephone Encounter (Signed)
The pt states he has been having increasing pain with hemorrhoids and has tried OTC prep H, sitz baths, fiber and recticare.  He is not constipated.  He has a history of anal fissure and used NTG for that.  He has been scheduled an appt to speak with Dr Carlean Purl on 5/13.

## 2018-06-22 ENCOUNTER — Encounter: Payer: Self-pay | Admitting: General Surgery

## 2018-06-23 ENCOUNTER — Encounter: Payer: Self-pay | Admitting: Internal Medicine

## 2018-06-23 ENCOUNTER — Ambulatory Visit (INDEPENDENT_AMBULATORY_CARE_PROVIDER_SITE_OTHER): Payer: 59 | Admitting: Internal Medicine

## 2018-06-23 ENCOUNTER — Other Ambulatory Visit: Payer: Self-pay

## 2018-06-23 DIAGNOSIS — K648 Other hemorrhoids: Secondary | ICD-10-CM

## 2018-06-23 HISTORY — DX: Other hemorrhoids: K64.8

## 2018-06-23 NOTE — Assessment & Plan Note (Addendum)
He is interested in banding so will arrange for a banding visit. I reviewed some of the risks with him. Based upon his story and colonoscopy findings do think his hemorrhoids are bothering him and not fissure. Will know more with in person evaluation

## 2018-06-23 NOTE — Progress Notes (Signed)
    TELEHEALTH ENCOUNTER IN SETTING OF COVID-19 PANDEMIC - REQUESTED BY PATIENT SERVICE PROVIDED BY TELEMEDECINE - TYPE: Telephone, A/V malfunctioned PATIENT LOCATION: Home PATIENT HAS CONSENTED TO TELEHEALTH VISIT PROVIDER LOCATION: OFFICE REFERRING PROVIDER: PCP is Dr. Janeann Forehand PARTICIPANTS OTHER THAN PATIENT: None TIME SPENT ON CALL: 10 minutes    Charles [name] 63 y.o. 1955/12/19 [id number]  Assessment & Plan:  Hemorrhoids, internal, with bleeding He is interested in banding so will arrange for a banding visit. I reviewed some of the risks with him. Based upon his story and colonoscopy findings do think his hemorrhoids are bothering him and not fissure. Will know more with in person evaluation  I appreciate the opportunity to care for this patient. CC: Wendie Agreste, MD     Subjective:   Chief Complaint: Rectal bleeding, hemorrhoids  HPI Charles [name] is a man with a history of colon polyps anal fissure and HIV who has been having rectal pressure and bleeding over the past couple of months.  He had been treated for a fissure with nitroglycerin ointment and that had resolved, he had a surveillance colonoscopy for history of colon polyps, in March.  There was a cecal adenoma.  He had internal hemorrhoids.  Since that time he has been struggling with bleeding on a daily basis will see blood with wiping, he will have an urge to defecate and no or minimal stool come out.  Then later he will have a more normal stool streaked with blood.  The blood is bright red in color.  Minimal if any pain really just some discomfort or soreness.  I do not get signs or symptoms of prolapse based upon the history.    Allergies  Allergen Reactions  . Efavirenz Rash  . Emtricitabine-Tenofovir Df Rash   Current Meds  Medication Sig  . Abacavir-Dolutegravir-Lamivud (TRIUMEQ) 600-50-300 MG TABS Take 1 tablet by mouth daily.   . Boswellia-Glucosamine-Vit D (OSTEO BI-FLEX ONE PER DAY PO)  Take 2 tablets by mouth daily.   . valACYclovir (VALTREX) 1000 MG tablet Take 1 tablet (1,000 mg total) by mouth daily. For 5 days per occurrence. (Patient taking differently: Take 1,000 mg by mouth daily. For 5 days per occurrence.  Uses prn)   Past Medical History:  Diagnosis Date  . Arthritis    fingers,left knee  . Hemorrhoids, internal, with bleeding 06/23/2018  . HIV positive ([place])   . Personal history of colonic adenoma 02/24/2013   Past Surgical History:  Procedure Laterality Date  . COLONOSCOPY    . PROSTATE BIOPSY    . 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   Review of Systems As per HPI

## 2018-06-23 NOTE — Patient Instructions (Signed)
As we discussed we will contact you to arrange an appointment for hemorrhoidal banding.    I appreciate the opportunity to care for you. Gatha Mayer, MD, [facility]

## 2018-06-25 ENCOUNTER — Telehealth: Payer: Self-pay

## 2018-06-25 NOTE — Telephone Encounter (Signed)
Covid-19 travel screening questions  Have you traveled in the last 14 days? no If yes where?  Do you now or have you had a fever in the last 14 days? no  Do you have any respiratory symptoms of shortness of breath or cough now or in the last 14 days? no  Do you have a medical history of Congestive Heart Failure? no  Do you have a medical history of lung disease? no  Do you have any family members or close contacts with diagnosed or suspected Covid-19? no       

## 2018-06-28 ENCOUNTER — Other Ambulatory Visit: Payer: Self-pay

## 2018-06-28 ENCOUNTER — Ambulatory Visit (INDEPENDENT_AMBULATORY_CARE_PROVIDER_SITE_OTHER): Payer: 59 | Admitting: Internal Medicine

## 2018-06-28 ENCOUNTER — Encounter: Payer: Self-pay | Admitting: Internal Medicine

## 2018-06-28 DIAGNOSIS — K648 Other hemorrhoids: Secondary | ICD-10-CM | POA: Diagnosis not present

## 2018-06-28 NOTE — Assessment & Plan Note (Signed)
Banded RP and LL - RA failed Nodular mucosa on DRE

## 2018-06-28 NOTE — Patient Instructions (Signed)
HEMORRHOID BANDING PROCEDURE    FOLLOW-UP CARE   1. The procedure you have had should have been relatively painless since the banding of the area involved does not have nerve endings and there is no pain sensation.  The rubber band cuts off the blood supply to the hemorrhoid and the band may fall off as soon as 48 hours after the banding (the band may occasionally be seen in the toilet bowl following a bowel movement). You may notice a temporary feeling of fullness in the rectum which should respond adequately to plain Tylenol or Motrin.  2. Following the banding, avoid strenuous exercise that evening and resume full activity the next day.  A sitz bath (soaking in a warm tub) or bidet is soothing, and can be useful for cleansing the area after bowel movements.     3. To avoid constipation, take two tablespoons of natural wheat bran, natural oat bran, flax, Benefiber or any over the counter fiber supplement and increase your water intake to 7-8 glasses daily.    4. Unless you have been prescribed anorectal medication, do not put anything inside your rectum for two weeks: No suppositories, enemas, fingers, etc.  5. Occasionally, you may have more bleeding than usual after the banding procedure.  This is often from the untreated hemorrhoids rather than the treated one.  Don't be concerned if there is a tablespoon or so of blood.  If there is more blood than this, lie flat with your bottom higher than your head and apply an ice pack to the area. If the bleeding does not stop within a half an hour or if you feel faint, call our office at [phone] or go to the emergency room.  6. Problems are not common; however, if there is a substantial amount of bleeding, severe pain, chills, fever or difficulty passing urine (very rare) or other problems, you should call us at [phone] or report to the nearest emergency room.  7. Do not stay seated continuously for more than 2-3 hours for a day or two  after the procedure.  Tighten your buttock muscles 10-15 times every two hours and take 10-15 deep breaths every 1-2 hours.  Do not spend more than a few minutes on the toilet if you cannot empty your bowel; instead re-visit the toilet at a later time.    We will see you back June 1st for additional banding.   I appreciate the opportunity to care for you. Silvano Rusk, MD, [facility]

## 2018-06-30 NOTE — Progress Notes (Signed)
   HEMORRHOID BANDING  Internal/external hemorrhoids seen at colonoscopy [place]  Persistent rectal bleeding   Rectal - slightly indurated mucosa, no mass  Anoscopy Grade 2 inflamed internal hemorrhoids all 3 positions  PROCEDURE NOTE: The patient presents with symptomatic grade 2  hemorrhoids, requesting rubber band ligation of his/her hemorrhoidal disease.  All risks, benefits and alternative forms of therapy were described and informed consent was obtained.   The anorectum was pre-medicated with 0.125% NTG and 5% lidocaine The decision was made to band the RA, RP and LL internal hemorrhoids, and the [place] was used to perform band ligation without complication but the RA band did not attach.  Digital anorectal examination was then performed to assure proper positioning of the band, and to adjust the banded tissue as required.  The patient was discharged home without pain or other issues.  Dietary and behavioral recommendations were given and along with follow-up instructions.       The patient will return June 48for  follow-up and possible additional banding as required. No complications were encountered and the patient tolerated the procedure well.   Follow-up on indurated mucosa also

## 2018-07-12 ENCOUNTER — Other Ambulatory Visit: Payer: Self-pay

## 2018-07-12 ENCOUNTER — Ambulatory Visit (INDEPENDENT_AMBULATORY_CARE_PROVIDER_SITE_OTHER): Payer: 59 | Admitting: Internal Medicine

## 2018-07-12 ENCOUNTER — Encounter: Payer: Self-pay | Admitting: Internal Medicine

## 2018-07-12 ENCOUNTER — Telehealth: Payer: Self-pay

## 2018-07-12 VITALS — BP 106/78 | HR 96 | Temp 98.3°F | Ht 70.0 in | Wt 222.0 lb

## 2018-07-12 DIAGNOSIS — K6289 Other specified diseases of anus and rectum: Secondary | ICD-10-CM

## 2018-07-12 DIAGNOSIS — K648 Other hemorrhoids: Secondary | ICD-10-CM

## 2018-07-12 MED ORDER — MESALAMINE 1000 MG RE SUPP: 1000.0000 mg | Freq: Every day | RECTAL | 1 refills | Status: DC

## 2018-07-12 NOTE — Patient Instructions (Signed)
There is inflammation in the rectum - we call that proctitis.  Usually no obvious cause - immune system reacts against the body.  I have prescribed mesalamine rectal suppository to take 1 each night.  Let's regroup in about 1 month.  I appreciate the opportunity to care for you. Gatha Mayer, MD, [facility]

## 2018-07-12 NOTE — Telephone Encounter (Signed)
Covid-19 screening questions  Have you traveled in the last 14 days? no If yes where?  Do you now or have you had a fever in the last 14 days? no  Do you have any respiratory symptoms of shortness of breath or cough now or in the last 14 days? no  Do you have any family members or close contacts with diagnosed or suspected Covid-19 in the past 14 days? no  Have you been tested for Covid-19 and found to be positive? no       

## 2018-07-12 NOTE — Progress Notes (Signed)
    The patient presents for repeat hemorrhoid banding or at least possible banding.  He has internal hemorrhoids identified at a colonoscopy.  He underwent banding of the right posterior and right anterior internal hemorrhoids on May 18, initially he felt better but then he started having aching pain and bleeding again.  At that time I noted he had some induration of the anal canal on digital exam.  Anoscopy showed the internal hemorrhoids.  He is describing a fair amount of mucus with bowel movements as well.  That has come off and on at times he says.   Digital rectal exam is repeated and is mildly tender and there is induration in the anal canal though not as prominent as the last time, an anoscope was inserted and demonstrates boggy inflamed erythematous mucosa with mucoid exudate and aphthous ulcers.  There are also post banding ulcers observed.  This looks like proctitis.  Hemorrhoids are improved overall.   I am going to treat him with Canasa suppository and have him follow-up in a month.  It may be that he has an ulcerative proctitis that I am only just now realizing is the cause of his symptoms.  He clearly had grade 2 hemorrhoids but I am now thinking these were not the major cause of his problems.  In the past we had thought he had a fissure as well but that is looking less likely also.   He denies any anal receptive intercourse though he has participated in that previously it is been many years ago.  So I do not think this is infectious.  CC: Wendie Agreste, MD

## 2018-07-19 ENCOUNTER — Telehealth (INDEPENDENT_AMBULATORY_CARE_PROVIDER_SITE_OTHER): Payer: 59 | Admitting: Family Medicine

## 2018-07-19 ENCOUNTER — Other Ambulatory Visit: Payer: Self-pay

## 2018-07-19 DIAGNOSIS — Z7251 High risk heterosexual behavior: Secondary | ICD-10-CM

## 2018-07-19 DIAGNOSIS — K625 Hemorrhage of anus and rectum: Secondary | ICD-10-CM

## 2018-07-19 DIAGNOSIS — R739 Hyperglycemia, unspecified: Secondary | ICD-10-CM

## 2018-07-19 DIAGNOSIS — K6289 Other specified diseases of anus and rectum: Secondary | ICD-10-CM | POA: Diagnosis not present

## 2018-07-19 NOTE — Progress Notes (Signed)
Virtual Visit via Video Note  I connected with Charles [name] on 07/19/18 at 10:45 AM by a video enabled telemedicine application and verified that I am speaking with the correct person using two identifiers.   I discussed the limitations, risks, security and privacy concerns of performing an evaluation and management service by telephone and the availability of in person appointments. I also discussed with the patient that there may be a patient responsible charge related to this service. The patient expressed understanding and agreed to proceed, consent obtained  Chief complaint:  Proctitis, STI  History of Present Illness: Charles [name] is a 63 y.o. male    BRBPR: Has been followed by gastroenterology, Dr. Carlean Purl.  Most recently treated for proctitis June 1, note reviewed. Prior treatment for possible anal fissure, then hemorrhoid treatment with banding. Most recent plan for with Canasa suppository, plan for 1 month follow-up.  Min sx's of blood in stool since January, more steady since February.  New partner in December -- unprotected receptive intercourse in December with receptive anal intercourse. Concerned about possible STI.  No penile discharge.  Has used canasa past week - some improvement but still some blood noted. Some mucus on stool.  appt July 1st with GI. Considering repeat colonoscopy?   Hyperglycemia  - 124 in December at ID, but not fasting- just had breakfast.   Health maintenance: Up-to-date on colonoscopy, hep C screen has been completed.   History of HIV infection, takes Triumeq.  Followed by [facility] infectious [facility]. Next appt June 25.  Meds working when last tested. Last seen in December (unprotected intercourse since that time).  Negative trich, GC/CT, RPR nonreactive.   History of HSV infection, has been prescribed 1000 mg Valtrex daily for 5 days per recurrence, last prescription 2018. No recent flair. No perianal rash/blisters noted  Patient  Active Problem List   Diagnosis Date Noted  . Hemorrhoids, internal, with bleeding 06/23/2018  . Personal history of colonic adenoma 02/24/2013  . HIV (human immunodeficiency virus infection) ([place]) 05/01/2011   Past Medical History:  Diagnosis Date  . Arthritis    fingers,left knee  . Hemorrhoids, internal, with bleeding 06/23/2018  . HIV positive ([place])   . Personal history of colonic adenoma 02/24/2013   Past Surgical History:  Procedure Laterality Date  . COLONOSCOPY    . HEMORRHOID BANDING    . PROSTATE BIOPSY    . TONSILLECTOMY     at age 75   Allergies  Allergen Reactions  . Efavirenz Rash  . Emtricitabine-Tenofovir Df Rash   Prior to Admission medications   Medication Sig Start Date End Date Taking? Authorizing Provider  Abacavir-Dolutegravir-Lamivud (TRIUMEQ) 600-50-300 MG TABS Take 1 tablet by mouth daily.    Yes [provider]  Boswellia-Glucosamine-Vit D (OSTEO BI-FLEX ONE PER DAY PO) Take 2 tablets by mouth daily.    Yes [provider]  valACYclovir (VALTREX) 1000 MG tablet Take 1 tablet (1,000 mg total) by mouth daily. For 5 days per occurrence. Patient taking differently: Take 1,000 mg by mouth daily. For 5 days per occurrence.  Uses prn 11/03/16  Yes Wendie Agreste, MD   Social History   Socioeconomic History  . Marital status: Married    Spouse name: Not on file  . Number of children: 0  . Years of education: Not on file  . Highest education level: Not on file  Occupational History  . Occupation: Chief Executive Officer  Social Needs  . Financial resource strain: Not on file  .  Food insecurity:    Worry: Not on file    Inability: Not on file  . Transportation needs:    Medical: Not on file    Non-medical: Not on file  Tobacco Use  . Smoking status: Never Smoker  . Smokeless tobacco: Never Used  Substance and Sexual Activity  . Alcohol use: No  . Drug use: No  . Sexual activity: Not on file  Lifestyle  . Physical activity:    Days per  week: Not on file    Minutes per session: Not on file  . Stress: Not on file  Relationships  . Social connections:    Talks on phone: Not on file    Gets together: Not on file    Attends religious service: Not on file    Active member of club or organization: Not on file    Attends meetings of clubs or organizations: Not on file    Relationship status: Not on file  . Intimate partner violence:    Fear of current or ex partner: Not on file    Emotionally abused: Not on file    Physically abused: Not on file    Forced sexual activity: Not on file  Other Topics Concern  . 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  Assessment and Plan: Proctitis Rectal bleeding Unprotected sexual intercourse  -Persistent bright red blood per rectum, recently diagnosed with proctitis, followed by gastroenterology.  Some improvement with Canasa.  Does not report unprotected sexual intercourse prior to onset of the symptoms and concern for STI.  Differential includes gonococcal infection.  Denies penile symptoms or other extra genital symptoms.  -An office visit tomorrow morning for perianal gonococcal/chlamydial swab.  Plan for RPR at that time as well.  Has follow-up with infectious disease for ongoing HIV monitoring in a few weeks.  -Continue follow-up with gastroenterology as planned  Hyperglycemia  -Likely related to nonfasting state when last blood work obtained.  As we will be checking blood work tomorrow A1c for prediabetes/diabetes screening   Follow Up Instructions: In office tomorrow am for swab only.    I discussed the assessment and treatment plan with the patient. The patient was provided an opportunity to ask questions and all were answered. The patient agreed with the  plan and demonstrated an understanding of the instructions.   The patient was advised to call back or seek an in-person evaluation if the symptoms worsen or if the condition fails to improve as anticipated.  I provided 21 minutes of non-face-to-face time during this encounter.   Wendie Agreste, MD

## 2018-07-19 NOTE — Patient Instructions (Signed)
° ° ° °  If you have lab work done today you will be contacted with your lab results within the next 2 weeks.  If you have not heard from us then please contact us. The fastest way to get your results is to register for My Chart. ° ° °IF you received an x-ray today, you will receive an invoice from [facility]. Please contact [facility] at [phone] with questions or concerns regarding your invoice.  ° °IF you received labwork today, you will receive an invoice from [facility]. Please contact [facility] at [phone] with questions or concerns regarding your invoice.  ° °Our billing staff will not be able to assist you with questions regarding bills from these companies. ° °You will be contacted with the lab results as soon as they are available. The fastest way to get your results is to activate your My Chart account. Instructions are located on the last page of this paperwork. If you have not heard from us regarding the results in 2 weeks, please contact this office. °  ° ° ° °

## 2018-07-19 NOTE — Progress Notes (Signed)
CC-(?)- Spoke with patient it was unclear what he is coming in for. He stated he spoke with you and he supposed to come in for some lab work. Patient have not been seen by you since 03/09/2017. Looking back through the notes I do not see where you have any requests. I see patient has been seen by Gertie Fey recently. Only thing I could get out of the patient is he needed to see you to have labs done.

## 2018-07-20 ENCOUNTER — Ambulatory Visit (INDEPENDENT_AMBULATORY_CARE_PROVIDER_SITE_OTHER): Payer: 59 | Admitting: Family Medicine

## 2018-07-20 ENCOUNTER — Other Ambulatory Visit (HOSPITAL_COMMUNITY)
Admission: RE | Admit: 2018-07-20 | Discharge: 2018-07-20 | Disposition: A | Discharge status: Home / Self Care | Payer: 59 | Source: Ambulatory Visit | Attending: Family Medicine | Admitting: Family Medicine

## 2018-07-20 ENCOUNTER — Encounter: Payer: Self-pay | Admitting: Family Medicine

## 2018-07-20 ENCOUNTER — Other Ambulatory Visit: Payer: Self-pay

## 2018-07-20 VITALS — BP 140/80 | HR 78 | Temp 98.2°F | Resp 14 | Wt 221.4 lb

## 2018-07-20 DIAGNOSIS — Z113 Encounter for screening for infections with a predominantly sexual mode of transmission: Secondary | ICD-10-CM | POA: Diagnosis present

## 2018-07-20 DIAGNOSIS — R739 Hyperglycemia, unspecified: Secondary | ICD-10-CM

## 2018-07-20 DIAGNOSIS — K6289 Other specified diseases of anus and rectum: Secondary | ICD-10-CM

## 2018-07-20 DIAGNOSIS — Z7251 High risk heterosexual behavior: Secondary | ICD-10-CM | POA: Insufficient documentation

## 2018-07-20 NOTE — Patient Instructions (Signed)
° ° ° °  If you have lab work done today you will be contacted with your lab results within the next 2 weeks.  If you have not heard from us then please contact us. The fastest way to get your results is to register for My Chart. ° ° °IF you received an x-ray today, you will receive an invoice from [facility]. Please contact [facility] at [phone] with questions or concerns regarding your invoice.  ° °IF you received labwork today, you will receive an invoice from [facility]. Please contact [facility] at [phone] with questions or concerns regarding your invoice.  ° °Our billing staff will not be able to assist you with questions regarding bills from these companies. ° °You will be contacted with the lab results as soon as they are available. The fastest way to get your results is to activate your My Chart account. Instructions are located on the last page of this paperwork. If you have not heard from us regarding the results in 2 weeks, please contact this office. °  ° ° ° °

## 2018-07-20 NOTE — Progress Notes (Signed)
   Subjective:    Patient ID: Charles [name], male    DOB: [birth year], 63 y.o.   MRN: [id number]  HPI Charles [name] is a 63 y.o. male  See office visit yesterday.  Currently being treated for proctitis, but noted unprotected receptive anal intercourse in December with new partner.  Symptoms seem to occur after that time.  Evaluated by telemedicine yesterday, here for blood work today and rectal swab for gonorrhea/chlamydia.  Also noted hyperglycemia on previous blood work with infectious disease.  Plan for A1c screening today   Review of Systems     Objective:   Physical Exam Constitutional:      General: He is not in acute distress.    Appearance: He is well-developed.  HENT:     Head: Normocephalic and atraumatic.  Cardiovascular:     Rate and Rhythm: Normal rate.  Pulmonary:     Effort: Pulmonary effort is normal.  Genitourinary:    Comments: No external rash/lesions. Min dried blood just inside anus. No discharge. Swab obtained at anus.  Neurological:     Mental Status: He is alert and oriented to person, place, and time.     Vitals:   07/20/18 0810  BP: 140/80  Pulse: 78  Resp: 14  Temp: 98.2 F (36.8 C)  TempSrc: Oral  SpO2: 97%  Weight: 221 lb 6.4 oz (100.4 kg)       Assessment & Plan:  Charles [name] is a 63 y.o. male Unprotected sexual intercourse - Plan: RPR, GC/Chlamydia probe amp ([place])not at [facility] Proctitis - Plan: GC/Chlamydia probe amp ([place])not at [facility] Routine screening for STI (sexually transmitted infection) - Plan: RPR, GC/Chlamydia probe amp ([place])not at [facility]  -Perianal swab obtained for chlamydia/gonorrhea, check RPR.  History of HIV, followed by infectious disease.  -Continue follow-up with gastroenterology for proctitis.  Hyperglycemia - Plan: Hemoglobin A1c   No orders of the defined types were placed in this encounter.  Patient Instructions       If you have lab work done today you will be contacted  with your lab results within the next 2 weeks.  If you have not heard from [place] then please contact us. The fastest way to get your results is to register for My Chart.   IF you received an x-ray today, you will receive an invoice from [facility]. Please contact [facility] at [phone] with questions or concerns regarding your invoice.   IF you received labwork today, you will receive an invoice from [facility]. Please contact [facility] at [phone] with questions or concerns regarding your invoice.   Our billing staff will not be able to assist you with questions regarding bills from these companies.  You will be contacted with the lab results as soon as they are available. The fastest way to get your results is to activate your My Chart account. Instructions are located on the last page of this paperwork. If you have not heard from [place] regarding the results in 2 weeks, please contact this office.         Signed,   Merri Ray, MD [facility].  07/20/18 8:30 AM

## 2018-07-21 LAB — RPR: RPR Ser Ql: REACTIVE — AB

## 2018-07-21 LAB — RPR, QUANT+TP ABS (REFLEX)
Rapid Plasma Reagin, Quant: 1:2 {titer} — ABNORMAL HIGH
T Pallidum Abs: REACTIVE — AB

## 2018-07-21 LAB — HEMOGLOBIN A1C
Est. average glucose Bld gHb Est-mCnc: 88 mg/dL
Hgb A1c MFr Bld: 4.7 % — ABNORMAL LOW (ref 4.8–5.6)

## 2018-07-22 ENCOUNTER — Other Ambulatory Visit: Payer: Self-pay | Admitting: Family Medicine

## 2018-07-22 DIAGNOSIS — A749 Chlamydial infection, unspecified: Secondary | ICD-10-CM

## 2018-07-22 LAB — GC/CHLAMYDIA PROBE AMP (~~LOC~~) NOT AT ARMC
Chlamydia: POSITIVE — AB
Neisseria Gonorrhea: NEGATIVE

## 2018-07-22 MED ORDER — AZITHROMYCIN 250 MG PO TABS: 1000.0000 mg | ORAL_TABLET | Freq: Once | ORAL | 0 refills | Status: AC

## 2018-08-11 ENCOUNTER — Ambulatory Visit: Payer: 59 | Admitting: Internal Medicine

## 2018-11-26 IMAGING — DX DG CHEST 2V
2 series · 2 of 2 positions shown · non-contrast
Comparison: 10/22/2012

CLINICAL DATA: Cough, fever

EXAM:
CHEST  2 VIEW

[chest pa (1 of 2)]
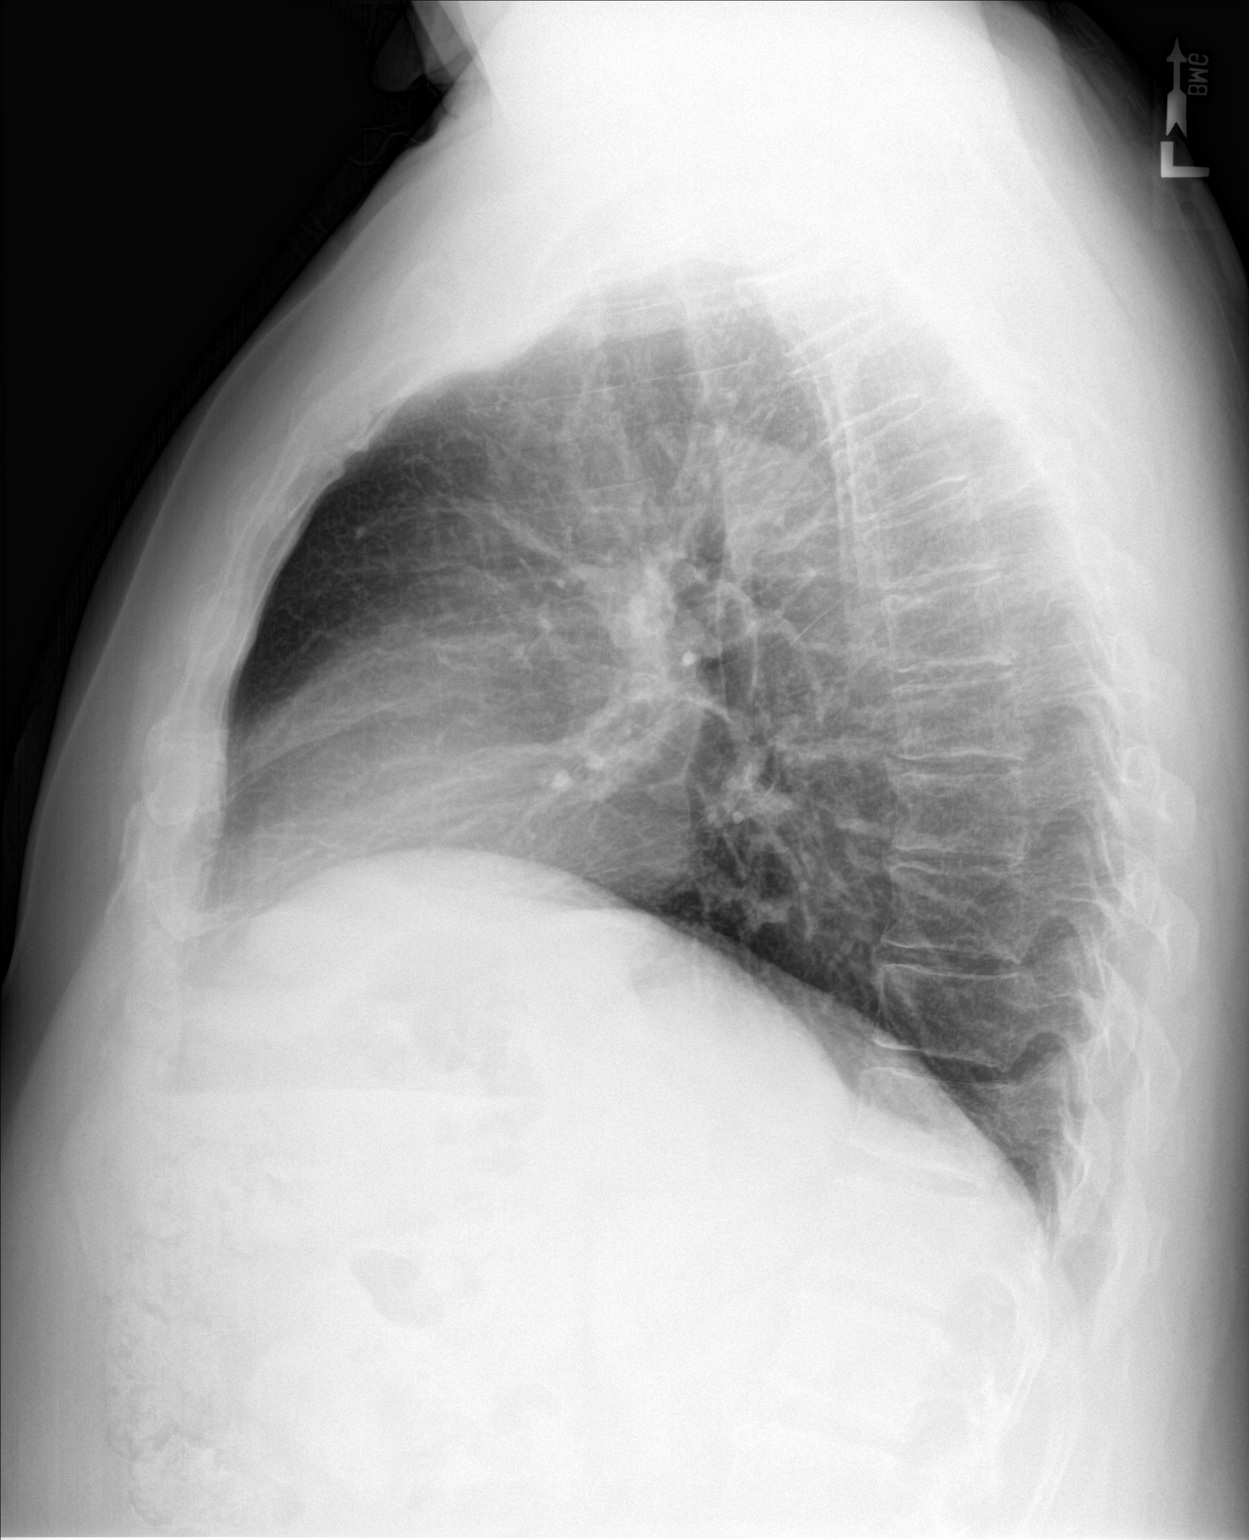

[chest pa (2 of 2)]
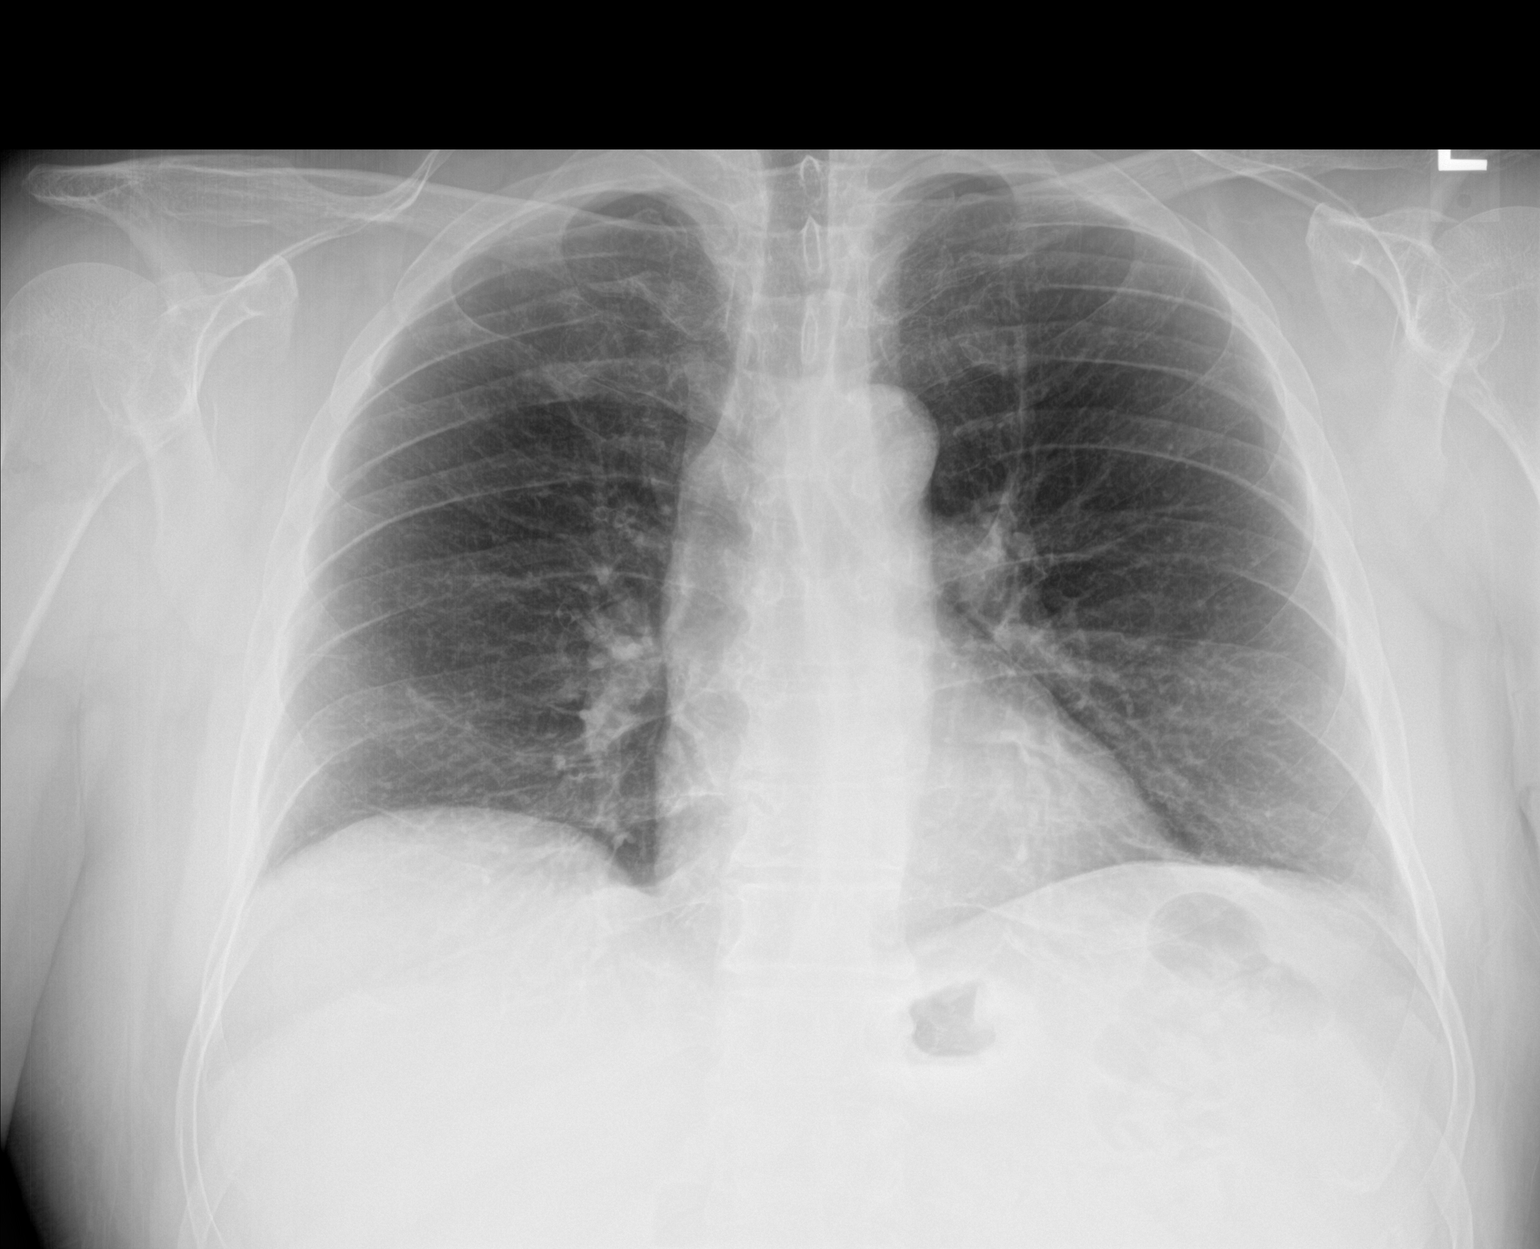

[2 of 2 positions shown; findings below may reference images not displayed]

FINDINGS: The heart size and mediastinal contours are within normal limits.
Both lungs are clear. The visualized skeletal structures are
unremarkable.
IMPRESSION: No active cardiopulmonary disease.

## 2019-01-03 ENCOUNTER — Other Ambulatory Visit: Payer: Self-pay

## 2019-01-03 ENCOUNTER — Ambulatory Visit (INDEPENDENT_AMBULATORY_CARE_PROVIDER_SITE_OTHER): Payer: 59 | Admitting: Emergency Medicine

## 2019-01-03 ENCOUNTER — Encounter: Payer: Self-pay | Admitting: Emergency Medicine

## 2019-01-03 VITALS — BP 125/82 | HR 77 | Temp 98.3°F | Resp 16 | Ht 70.0 in | Wt 221.6 lb

## 2019-01-03 DIAGNOSIS — W57XXXA Bitten or stung by nonvenomous insect and other nonvenomous arthropods, initial encounter: Secondary | ICD-10-CM

## 2019-01-03 DIAGNOSIS — L089 Local infection of the skin and subcutaneous tissue, unspecified: Secondary | ICD-10-CM | POA: Diagnosis not present

## 2019-01-03 DIAGNOSIS — S40861A Insect bite (nonvenomous) of right upper arm, initial encounter: Secondary | ICD-10-CM | POA: Diagnosis not present

## 2019-01-03 MED ORDER — MUPIROCIN 2 % EX OINT: TOPICAL_OINTMENT | CUTANEOUS | 1 refills | Status: DC

## 2019-01-03 MED ORDER — CEFADROXIL 500 MG PO CAPS: 500.0000 mg | ORAL_CAPSULE | Freq: Two times a day (BID) | ORAL | 0 refills | Status: AC

## 2019-01-03 NOTE — Progress Notes (Signed)
Charles [name] 63 y.o.   Chief Complaint  Patient presents with  . Insect Bite    Thursday on Right arm with redness and swelling    HISTORY OF PRESENT ILLNESS: [name] complaining of insect bite to his right upper arm sustained last week now red and swollen.  Possible infection.  No other significant symptoms.  HPI   Prior to Admission medications   Medication Sig Start Date End Date Taking? Authorizing Provider  Abacavir-Dolutegravir-Lamivud (TRIUMEQ) 600-50-300 MG TABS Take 1 tablet by mouth daily.    Yes [provider]  Boswellia-Glucosamine-Vit D (OSTEO BI-FLEX ONE PER DAY PO) Take 2 tablets by mouth daily.    Yes [provider]  valACYclovir (VALTREX) 1000 MG tablet Take 1 tablet (1,000 mg total) by mouth daily. For 5 days per occurrence. 11/03/16  Yes Charles Agreste, MD    Allergies  Allergen Reactions  . Efavirenz Rash  . Emtricitabine-Tenofovir Df Rash    Patient Active Problem List   Diagnosis Date Noted  . Personal history of colonic adenoma 02/24/2013  . HIV (human immunodeficiency virus infection) ([place]) 05/01/2011    Past Medical History:  Diagnosis Date  . Arthritis    fingers,left knee  . Hemorrhoids, internal, with bleeding 06/23/2018  . HIV positive ([place])   . Personal history of colonic adenoma 02/24/2013    Past Surgical History:  Procedure Laterality Date  . COLONOSCOPY    . HEMORRHOID BANDING    . PROSTATE BIOPSY    . TONSILLECTOMY     at age 25    Social History   Socioeconomic History  . Marital status: Married    Spouse name: Not on file  . Number of children: 0  . Years of education: Not on file  . Highest education level: Not on file  Occupational History  . Occupation: Chief Executive Officer  Social Needs  . Financial resource strain: Not on file  . Food insecurity    Worry: Not on file    Inability: Not on file  . Transportation needs    Medical: Not on file    Non-medical: Not on file  Tobacco Use   . Smoking status: Never Smoker  . Smokeless tobacco: Never Used  Substance and Sexual Activity  . Alcohol use: No  . Drug use: No  . Sexual activity: Not on file  Lifestyle  . Physical activity    Days per week: Not on file    Minutes per session: Not on file  . Stress: Not on file  Relationships  . Social Herbalist on phone: Not on file    Gets together: Not on file    Attends religious service: Not on file    Active member of club or organization: Not on file    Attends meetings of clubs or organizations: Not on file    Relationship status: Not on file  . Intimate partner violence    Fear of current or ex partner: Not on file    Emotionally abused: Not on file    Physically abused: Not on file    Forced sexual activity: Not on file  Other Topics Concern  . 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 Pancreatic cancer Mother   .  Diabetes Mother   . Bladder Cancer Father        smoker in his early days-cigar  . Kidney disease Father        on dyalsis  . Prostate cancer Father        had seed implant  . Diabetes Father   . Colon cancer Neg Hx   . Esophageal cancer Neg Hx   . Stomach cancer Neg Hx   . Rectal cancer Neg Hx      Review of Systems  Constitutional: Negative.  Negative for chills and fever.  HENT: Negative.  Negative for congestion and sore throat.   Respiratory: Negative.  Negative for cough and shortness of breath.   Cardiovascular: Negative.  Negative for chest pain and palpitations.  Gastrointestinal: Negative.  Negative for abdominal pain, diarrhea, nausea and vomiting.  Musculoskeletal: Negative for back pain, myalgias and neck pain.  Skin:       Wound infection  Neurological: Negative for dizziness and headaches.  All other systems reviewed and are negative.    Today's  Vitals   01/03/19 1337  BP: 125/82  Pulse: 77  Resp: 16  Temp: 98.3 F (36.8 C)  TempSrc: Oral  SpO2: 93%  Weight: 221 lb 9.6 oz (100.5 kg)  Height: 5\' 10"  (1.778 m)   Body mass index is 31.8 kg/m.   Physical Exam Vitals signs reviewed.  Constitutional:      Appearance: Normal appearance.  HENT:     Head: Normocephalic.  Eyes:     Extraocular Movements: Extraocular movements intact.     Pupils: Pupils are equal, round, and reactive to light.  Neck:     Musculoskeletal: Normal range of motion.  Cardiovascular:     Rate and Rhythm: Normal rate.  Pulmonary:     Effort: Pulmonary effort is normal.  Skin:    General: Skin is warm and dry.     Capillary Refill: Capillary refill takes less than 2 seconds.     Comments: Right forearm: See picture below.  Area of cellulitis with erythema and swelling.  No abscess.  No fluctuation.  No crepitus.  No discharge.  Neurological:     General: No focal deficit present.     Mental Status: He is alert and oriented to person, place, and time.  Psychiatric:        Mood and Affect: Mood normal.        Behavior: Behavior normal.        ASSESSMENT & PLAN:  Charles [name] was seen today for insect bite.  Diagnoses and all orders for this visit:  Insect bite of right upper arm with infection, initial encounter -     cefadroxil (DURICEF) 500 MG capsule; Take 1 capsule (500 mg total) by mouth 2 (two) times daily for 7 days. -     mupirocin ointment (BACTROBAN) 2 %; Sig to affected area twice a day x 7 days.    Patient Instructions       If you have lab work done today you will be contacted with your lab results within the next 2 weeks.  If you have not heard from [place] then please contact us. The fastest way to get your results is to register for My Chart.   IF you received an x-ray today, you will receive an invoice from [facility]. Please contact [facility] at [phone] with questions or concerns regarding your  invoice.   IF you received labwork today, you will receive an invoice from [employer] [facility].  Please contact [facility] at [phone] with questions or concerns regarding your invoice.   Our billing staff will not be able to assist you with questions regarding bills from these companies.  You will be contacted with the lab results as soon as they are available. The fastest way to get your results is to activate your My Chart account. Instructions are located on the last page of this paperwork. If you have not heard from [place] regarding the results in 2 weeks, please contact this office.     Wound Infection A wound infection happens when germs start to grow in a wound. Germs that cause wound infections are most often bacteria. Other types of infections can occur as well. An infection can cause the wound to break open. Wound infections need treatment. If a wound infection is not treated, problems can happen. What are the causes?  Most often caused by germs (bacteria) that grow in a wound.  Other germs, such as yeast and funguses, can also cause wound infections. What increases the risk?  Having a weak body defense system (immune system).  Having diabetes.  Taking certain medicines (steroids) for a long time.  Smoking.  Being an older person.  Being overweight.  Taking certain medicines for cancer treatment. What are the signs or symptoms?  Having more redness, swelling, or pain at the wound site.  Having more blood or fluid at the wound site.  A bad smell coming from a wound or bandage (dressing).  Having a fever.  Feeling very tired.  Having warmth at or around the wound.  Having pus at the wound site. How is this treated?  This condition is most often treated with an antibiotic medicine. ? The infection should improve 24-48 hours after you start antibiotics. ? After 24-48 hours, redness around the wound should stop spreading. The wound should also be less painful. Follow these  instructions at home: Medicines  Take or apply over-the-counter and prescription medicines only as told by your doctor.  If you were prescribed an antibiotic medicine, take or apply it as told by your doctor. Do not stop using the antibiotic even if you start to feel better. Wound care   Clean the wound each day, or as told by your doctor. ? Wash the wound with mild soap and water. ? Rinse the wound with water to remove all soap. ? Pat the wound dry with a clean towel. Do not rub it.  Follow instructions from your doctor about how to take care of your wound. Make sure you: ? Wash your hands with soap and water before and after you change your bandage. If you cannot use soap and water, use hand sanitizer. ? Change your bandage as told by your doctor. ? Leave stitches (sutures), skin glue, or skin tape (adhesive) strips in place if your wound has been closed. They may need to stay in place for 2 weeks or longer. If tape strips get loose and curl up, you may trim the loose edges. Do not remove tape strips completely unless your doctor says it is okay. Some wounds are left open to heal on their own.  Check your wound every day for signs of infection. Watch for: ? More redness, swelling, or pain. ? More fluid or blood. ? Warmth. ? Pus or a bad smell. General instructions  Keep the bandage dry until your doctor says it can be removed.  Do not take baths, swim, or use a hot tub until your doctor approves. Ask  your doctor if you may take showers. You may only be allowed to take sponge baths.  Raise (elevate) the injured area above the level of your heart while you are sitting or lying down.  Do not scratch or pick at the wound.  Keep all follow-up visits as told by your doctor. This is important. Contact a doctor if:  Medicine does not help your pain.  You have more redness, swelling, or pain around your wound.  You have more fluid or blood coming from your wound.  Your wound feels  warm to the touch.  You have pus coming from your wound.  You notice a bad smell coming from your wound or your bandage.  Your wound that was closed breaks open. Get help right away if:  You have a red streak going away from your wound.  You have a fever. Summary  A wound infection happens when germs start to grow in a wound.  This condition is usually treated with an antibiotic medicine.  Follow instructions from your doctor about how to take care of your wound.  Contact a doctor if your wound infection does not start to get better in 24-48 hours, or your symptoms get worse.  Keep all follow-up visits as told by your doctor. This is important. This information is not intended to replace advice given to you by your health care provider. Make sure you discuss any questions you have with your health care provider. Document Released: 11/06/2007 Document Revised: 09/08/2017 Document Reviewed: 09/08/2017 Elsevier Patient Education  2020 Elsevier Inc.     Agustina Caroli, MD [facility]

## 2019-01-03 NOTE — Patient Instructions (Addendum)
If you have lab work done today you will be contacted with your lab results within the next 2 weeks.  If you have not heard from [place] then please contact us. The fastest way to get your results is to register for My Chart.   IF you received an x-ray today, you will receive an invoice from [facility]. Please contact [facility] at [phone] with questions or concerns regarding your invoice.   IF you received labwork today, you will receive an invoice from [facility]. Please contact [facility] at [phone] with questions or concerns regarding your invoice.   Our billing staff will not be able to assist you with questions regarding bills from these companies.  You will be contacted with the lab results as soon as they are available. The fastest way to get your results is to activate your My Chart account. Instructions are located on the last page of this paperwork. If you have not heard from [place] regarding the results in 2 weeks, please contact this office.     Wound Infection A wound infection happens when germs start to grow in a wound. Germs that cause wound infections are most often bacteria. Other types of infections can occur as well. An infection can cause the wound to break open. Wound infections need treatment. If a wound infection is not treated, problems can happen. What are the causes?  Most often caused by germs (bacteria) that grow in a wound.  Other germs, such as yeast and funguses, can also cause wound infections. What increases the risk?  Having a weak body defense system (immune system).  Having diabetes.  Taking certain medicines (steroids) for a long time.  Smoking.  Being an older person.  Being overweight.  Taking certain medicines for cancer treatment. What are the signs or symptoms?  Having more redness, swelling, or pain at the wound site.  Having more blood or fluid at the wound site.  A bad smell coming from a wound or bandage  (dressing).  Having a fever.  Feeling very tired.  Having warmth at or around the wound.  Having pus at the wound site. How is this treated?  This condition is most often treated with an antibiotic medicine. ? The infection should improve 24-48 hours after you start antibiotics. ? After 24-48 hours, redness around the wound should stop spreading. The wound should also be less painful. Follow these instructions at home: Medicines  Take or apply over-the-counter and prescription medicines only as told by your doctor.  If you were prescribed an antibiotic medicine, take or apply it as told by your doctor. Do not stop using the antibiotic even if you start to feel better. Wound care   Clean the wound each day, or as told by your doctor. ? Wash the wound with mild soap and water. ? Rinse the wound with water to remove all soap. ? Pat the wound dry with a clean towel. Do not rub it.  Follow instructions from your doctor about how to take care of your wound. Make sure you: ? Wash your hands with soap and water before and after you change your bandage. If you cannot use soap and water, use hand sanitizer. ? Change your bandage as told by your doctor. ? Leave stitches (sutures), skin glue, or skin tape (adhesive) strips in place if your wound has been closed. They may need to stay in place for 2 weeks or longer. If tape strips get loose and curl up,  you may trim the loose edges. Do not remove tape strips completely unless your doctor says it is okay. Some wounds are left open to heal on their own.  Check your wound every day for signs of infection. Watch for: ? More redness, swelling, or pain. ? More fluid or blood. ? Warmth. ? Pus or a bad smell. General instructions  Keep the bandage dry until your doctor says it can be removed.  Do not take baths, swim, or use a hot tub until your doctor approves. Ask your doctor if you may take showers. You may only be allowed to take sponge  baths.  Raise (elevate) the injured area above the level of your heart while you are sitting or lying down.  Do not scratch or pick at the wound.  Keep all follow-up visits as told by your doctor. This is important. Contact a doctor if:  Medicine does not help your pain.  You have more redness, swelling, or pain around your wound.  You have more fluid or blood coming from your wound.  Your wound feels warm to the touch.  You have pus coming from your wound.  You notice a bad smell coming from your wound or your bandage.  Your wound that was closed breaks open. Get help right away if:  You have a red streak going away from your wound.  You have a fever. Summary  A wound infection happens when germs start to grow in a wound.  This condition is usually treated with an antibiotic medicine.  Follow instructions from your doctor about how to take care of your wound.  Contact a doctor if your wound infection does not start to get better in 24-48 hours, or your symptoms get worse.  Keep all follow-up visits as told by your doctor. This is important. This information is not intended to replace advice given to you by your health care provider. Make sure you discuss any questions you have with your health care provider. Document Released: 11/06/2007 Document Revised: 09/08/2017 Document Reviewed: 09/08/2017 Elsevier Patient Education  2020 [employer].

## 2019-07-25 ENCOUNTER — Telehealth: Payer: Self-pay | Admitting: Family Medicine

## 2019-08-03 ENCOUNTER — Telehealth (INDEPENDENT_AMBULATORY_CARE_PROVIDER_SITE_OTHER): Payer: Commercial Managed Care - PPO | Admitting: Family Medicine

## 2019-08-03 ENCOUNTER — Encounter: Payer: Self-pay | Admitting: Family Medicine

## 2019-08-03 ENCOUNTER — Other Ambulatory Visit: Payer: Self-pay

## 2019-08-03 VITALS — Ht 70.0 in | Wt 204.0 lb

## 2019-08-03 DIAGNOSIS — R197 Diarrhea, unspecified: Secondary | ICD-10-CM | POA: Diagnosis not present

## 2019-08-03 NOTE — Progress Notes (Signed)
Virtual Visit via Video Note  I connected with Charles [name] on 08/03/19 at 5:41 PM by a video enabled telemedicine application and verified that I am speaking with the correct person using two identifiers.   I discussed the limitations, risks, security and privacy concerns of performing an evaluation and management service by telephone and the availability of in person appointments. I also discussed with the patient that there may be a patient responsible charge related to this service. The patient expressed understanding and agreed to proceed, consent obtained  Chief complaint: Chief Complaint  Patient presents with   Diarrhea    started 2 weeks ago. pt states his stomch is constintly bubbling and making noises. Also the pt reports that his abdominal area is sore in the center like the pt had been doing alot of abdominal exercises. stool is watery. pt states he is drinking water. pt has been only eating crackers and cookies to help control his stool from being to watery. Pt has started probiotic to try and help.    History of Present Illness: Charles [name] is a 64 y.o. male   Diarrhea: Started 2 weeks ago.  Out of nowhere, mid afternoon. Watery stool. Similar episode that night - brown watery stool. No blood. No incontinence. No fever, no vomiting. Thought was food related. No hx of IBS/IBD.  Following day watery stool again. Decreased po intake. Felt a little better 2 d later. Soft stool/oatmeal consistency, then watery stool again after eating.  Bland diet few days later- less watery stools. Regular meal next day - watery stool again. Recurrent watery stool for few hrs. Foul smelling stool that night. Went to [facility] 9 days ago. Treated with azithromycin 500mg  qd for 3 days. No relief.  Able to control diarrhea some with saltines.  Stomach feels slightly sore, but no pain with pushing on area. Feels like upset stomach.  Started probiotic 5 days ago - min relief.  ?min  improvement last Saturday, but not eating. Regular meal 3 days ago - watery diarrhea again, followed by soft consistency.  saltines and nilla wafers past 2 days. Small BM's past few days. Small streams of soft BM 2 days ago, but no watery stool. Few small soft formed stools today, then small watery stool earlier today. Drinking fluids normally. Able to walk 2 mi per day, usual activities.  Muscular soreness in upper chest and back once last week - now resolved.  No recent abx or hospital stay prior to diarrhea.  [place] [employer] few times 3 days before onset of diarrhea.  Drinking fluids, staying hydrated. No dizziness/near syncope.   Has infectious disease follow up with labs next week.  Hx of HIV. Last CD4 in 600's, undetectable viral load about 5-6 months ago.    Patient Active Problem List   Diagnosis Date Noted   Personal history of colonic adenoma 02/24/2013   HIV (human immunodeficiency virus infection) ([place]) 05/01/2011   Past Medical History:  Diagnosis Date   Arthritis    fingers,left knee   Hemorrhoids, internal, with bleeding 06/23/2018   HIV infection ([place])    Phreesia 08/02/2019   HIV positive ([place])    Personal history of colonic adenoma 02/24/2013   Past Surgical History:  Procedure Laterality Date   COLONOSCOPY     HEMORRHOID BANDING     PROSTATE BIOPSY     TONSILLECTOMY     at age 36   Allergies  Allergen Reactions   Efavirenz Rash   Emtricitabine-Tenofovir  Df Rash   Prior to Admission medications   Medication Sig Start Date End Date Taking? Authorizing Provider  Abacavir-Dolutegravir-Lamivud (TRIUMEQ) 600-50-300 MG TABS Take 1 tablet by mouth daily.    Yes [provider]  azithromycin (ZITHROMAX) 500 MG tablet Take 500 mg by mouth daily. 07/25/19  Yes [provider]  Boswellia-Glucosamine-Vit D (OSTEO BI-FLEX ONE PER DAY PO) Take 2 tablets by mouth daily.    Yes [provider]  mupirocin ointment  (BACTROBAN) 2 % Sig to affected area twice a day x 7 days. 01/03/19  Yes Sagardia, Ines Bloomer, MD  valACYclovir (VALTREX) 1000 MG tablet Take 1 tablet (1,000 mg total) by mouth daily. For 5 days per occurrence. 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   No alcohol tobacco or drug use reported   Social Determinants of Radio broadcast assistant Strain:    Difficulty of Paying Living Expenses:   Food Insecurity:    Worried About Charity fundraiser in the Last Year:    Arboriculturist in the Last Year:   Transportation Needs:    Film/video editor (Medical):    Lack of Transportation (Non-Medical):   Physical Activity:    Days of Exercise per Week:    Minutes of Exercise per Session:   Stress:    Feeling of Stress :   Social Connections:    Frequency of Communication with Friends and Family:    Frequency of Social Gatherings with Friends and Family:    Attends Religious Services:    Active Member of Clubs or Organizations:    Attends Archivist Meetings:    Marital Status:   Intimate Partner Violence:    Fear of Current or Ex-Partner:    Emotionally Abused:    Physically Abused:    Sexually Abused:     Observations/Objective: Vitals:   08/03/19 1250  Weight: 204 lb (92.5 kg)  Height: 5\' 10"  (1.778 m)  nontoxic, no distress. Appropriate responses.  All  questions answered.   Assessment and Plan: Watery diarrhea  - recurrent with intermittent improvements with soft semiformed stool. Differential includes infectious diarrhea. Hydrating well, nontoxic appearance on phone, normal activities.  Minimal change with azithromycin. Differential includes C diff, but semiformed stools and few per day only.   - check stool culture, C diff PCR on lab only visit tomorrow.   - has infectious disease follow up in 5 days. ER precautions given sooner if worsening.   Follow Up Instructions:  Lab only visit tomorrow.    I discussed the assessment and treatment plan with the patient. The patient was provided an opportunity to ask questions and all were answered. The patient agreed with the plan and demonstrated an understanding of the instructions.   The  patient was advised to call back or seek an in-person evaluation if the symptoms worsen or if the condition fails to improve as anticipated.  I provided 26 minutes of non-face-to-face time during this encounter.   Wendie Agreste, MD

## 2019-08-04 ENCOUNTER — Other Ambulatory Visit: Payer: Self-pay

## 2019-08-04 ENCOUNTER — Ambulatory Visit: Payer: Commercial Managed Care - PPO

## 2019-08-04 DIAGNOSIS — R197 Diarrhea, unspecified: Secondary | ICD-10-CM

## 2019-08-09 LAB — CLOSTRIDIUM DIFFICILE BY PCR: Toxigenic C. Difficile by PCR: NEGATIVE

## 2019-08-13 LAB — STOOL CULTURE: E coli, Shiga toxin Assay: NEGATIVE

## 2019-08-22 ENCOUNTER — Other Ambulatory Visit: Payer: Self-pay

## 2019-08-22 ENCOUNTER — Ambulatory Visit (INDEPENDENT_AMBULATORY_CARE_PROVIDER_SITE_OTHER): Payer: Commercial Managed Care - PPO | Admitting: Emergency Medicine

## 2019-08-22 ENCOUNTER — Encounter: Payer: Self-pay | Admitting: Emergency Medicine

## 2019-08-22 VITALS — BP 122/82 | HR 80 | Temp 97.6°F | Resp 16 | Ht 70.0 in | Wt 206.0 lb

## 2019-08-22 DIAGNOSIS — R590 Localized enlarged lymph nodes: Secondary | ICD-10-CM | POA: Diagnosis not present

## 2019-08-22 DIAGNOSIS — Z21 Asymptomatic human immunodeficiency virus [HIV] infection status: Secondary | ICD-10-CM

## 2019-08-22 MED ORDER — DOXYCYCLINE HYCLATE 100 MG PO TABS: 100.0000 mg | ORAL_TABLET | Freq: Two times a day (BID) | ORAL | 0 refills | Status: AC

## 2019-08-22 NOTE — Progress Notes (Signed)
Charles [name] 64 y.o.   Chief Complaint  Patient presents with  . Mass    per pt 12 days ago lumps under both arms and slightly painful    HISTORY OF PRESENT ILLNESS: This is a 64 y.o. male complaining of red and painful lumps under both arms for the past 10 days. No other significant symptoms.  HPI   Prior to Admission medications   Medication Sig Start Date End Date Taking? Authorizing Provider  abacavir-dolutegravir-lamiVUDine (TRIUMEQ) 600-50-300 MG tablet Take 1 tablet by mouth daily.   Yes [provider]  Ascorbic Acid (VITAMIN C) 1000 MG tablet Take 1,000 mg by mouth daily.   Yes [provider]  Boswellia-Glucosamine-Vit D (OSTEO BI-FLEX ONE PER DAY PO) Take 2 tablets by mouth daily.    Yes [provider]  Multiple Vitamin (MULTIVITAMIN) tablet Take 1 tablet by mouth daily.   Yes [provider]  valACYclovir (VALTREX) 1000 MG tablet Take 1 tablet (1,000 mg total) by mouth daily. For 5 days per occurrence. 11/03/16  Yes Wendie Agreste, MD  mupirocin ointment (BACTROBAN) 2 % Sig to affected area twice a day x 7 days. Patient not taking: Reported on 08/22/2019 01/03/19   Horald Pollen, MD    Allergies  Allergen Reactions  . Efavirenz Rash  . Emtricitabine-Tenofovir Df Rash    Patient Active Problem List   Diagnosis Date Noted  . Personal history of colonic adenoma 02/24/2013  . HIV (human immunodeficiency virus infection) ([place]) 05/01/2011    Past Medical History:  Diagnosis Date  . Arthritis    fingers,left knee  . Hemorrhoids, internal, with bleeding 06/23/2018  . HIV infection ([place])    Phreesia 08/02/2019  . HIV positive ([place])   . Personal history of colonic adenoma 02/24/2013    Past Surgical History:  Procedure Laterality Date  . COLONOSCOPY    . HEMORRHOID BANDING    . PROSTATE BIOPSY    . TONSILLECTOMY     at age 35    Social History   Socioeconomic History  . Marital status: Married    Spouse  name: Not on file  . Number of children: 0  . Years of education: Not on file  . Highest education level: Not on file  Occupational History  . Occupation: Chief Executive Officer  Tobacco Use  . Smoking status: Never Smoker  . Smokeless tobacco: Never Used  Vaping Use  . Vaping Use: Never used  Substance and Sexual Activity  . Alcohol use: No  . Drug use: No  . Sexual activity: Not on file  Other Topics Concern  . 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 Difficulty of Paying Living Expenses:   Food Insecurity:   . Worried About Charity fundraiser in the Last Year:   . Arboriculturist in the Last Year:   Transportation Needs:   . Film/video editor (Medical):   Marland Kitchen Lack of Transportation (Non-Medical):   Physical Activity:   . Days of Exercise per Week:   . Minutes of Exercise per Session:   Stress:   . Feeling of Stress :   Social Connections:   . Frequency of Communication with Friends and Family:   . Frequency of Social Gatherings with Friends and Family:   .  Attends Religious Services:   . Active Member of Clubs or Organizations:   . Attends Archivist Meetings:   Marland Kitchen Marital Status:   Intimate Partner Violence:   . Fear of Current or Ex-Partner:   . Emotionally Abused:   Marland Kitchen Physically Abused:   . Sexually Abused:     Family History  Problem Relation Age of Onset  . Pancreatic cancer Mother   . Diabetes Mother   . Bladder Cancer Father        smoker in his early days-cigar  . Kidney disease Father        on dyalsis  . Prostate cancer Father        had seed implant  . Diabetes Father   . Colon cancer Neg Hx   . Esophageal cancer Neg Hx   . Stomach cancer Neg Hx   . Rectal cancer Neg Hx      Review of Systems  Constitutional: Negative.  Negative for chills and fever.  HENT: Negative  for congestion and sore throat.   Respiratory: Negative.  Negative for cough and shortness of breath.   Cardiovascular: Negative.  Negative for chest pain and palpitations.  Gastrointestinal: Negative.  Negative for abdominal pain, diarrhea, nausea and vomiting.  Genitourinary: Negative.  Negative for dysuria and hematuria.  Skin: Positive for rash.  Neurological: Negative for dizziness and headaches.  All other systems reviewed and are negative.  Today's Vitals   08/22/19 1104  BP: 122/82  Pulse: 80  Resp: 16  Temp: 97.6 F (36.4 C)  TempSrc: Temporal  SpO2: 96%  Weight: 206 lb (93.4 kg)  Height: 5\' 10"  (1.778 m)   Body mass index is 29.56 kg/m.   Physical Exam Vitals reviewed.  Constitutional:      Appearance: Normal appearance.  HENT:     Head: Normocephalic.  Eyes:     Extraocular Movements: Extraocular movements intact.     Pupils: Pupils are equal, round, and reactive to light.  Cardiovascular:     Rate and Rhythm: Normal rate and regular rhythm.     Heart sounds: Normal heart sounds.  Pulmonary:     Effort: Pulmonary effort is normal.  Musculoskeletal:     Cervical back: Normal range of motion.  Lymphadenopathy:     Upper Body:     Right upper body: Axillary adenopathy present.     Left upper body: Axillary adenopathy present.  Skin:    General: Skin is warm and dry.     Comments: Mild redness to both axillary areas with slightly tender adenopathy  Neurological:     General: No focal deficit present.     Mental Status: He is alert and oriented to person, place, and time.  Psychiatric:        Mood and Affect: Mood normal.        Behavior: Behavior normal.      ASSESSMENT & PLAN: Charles [name] was seen today for mass.  Diagnoses and all orders for this visit:  Axillary lymphadenopathy -     doxycycline (VIBRA-TABS) 100 MG tablet; Take 1 tablet (100 mg total) by mouth 2 (two) times daily for 7 days.  Asymptomatic HIV infection ([place])    Patient  Instructions       If you have lab work done today you will be contacted with your lab results within the next 2 weeks.  If you have not heard from [place] then please contact us. The fastest way to get your results is to register  for My Chart.   IF you received an x-ray today, you will receive an invoice from [facility]. Please contact [facility] at [phone] with questions or concerns regarding your invoice.   IF you received labwork today, you will receive an invoice from [place]. Please contact [facility] at [phone] with questions or concerns regarding your invoice.   Our billing staff will not be able to assist you with questions regarding bills from these companies.  You will be contacted with the lab results as soon as they are available. The fastest way to get your results is to activate your My Chart account. Instructions are located on the last page of this paperwork. If you have not heard from [place] regarding the results in 2 weeks, please contact this office.     Lymphadenopathy  Lymphadenopathy means that your lymph glands are swollen or larger than normal (enlarged). Lymph glands, also called lymph nodes, are collections of tissue that filter bacteria, viruses, and waste from your bloodstream. They are part of your body's disease-fighting system (immune system), which protects your body from germs. There may be different causes of lymphadenopathy, depending on where it is in your body. Some types go away on their own. Lymphadenopathy can occur anywhere that you have lymph glands, including these areas:  Neck (cervical lymphadenopathy).  Chest (mediastinal lymphadenopathy).  Lungs (hilar lymphadenopathy).  Underarms (axillary lymphadenopathy).  Groin (inguinal lymphadenopathy). When your immune system responds to germs, infection-fighting cells and fluid build up in your lymph glands. This causes some swelling and enlargement. If the lymph glands do  not go back to normal after you have an infection or disease, your health care provider may do tests. These tests help to monitor your condition and find the reason why the glands are still swollen and enlarged. Follow these instructions at home:  Get plenty of rest.  Take over-the-counter and prescription medicines only as told by your health care provider. Your health care provider may recommend over-the-counter medicines for pain.  If directed, apply heat to swollen lymph glands as often as told by your health care provider. Use the heat source that your health care provider recommends, such as a moist heat pack or a heating pad. ? Place a towel between your skin and the heat source. ? Leave the heat on for 20-30 minutes. ? Remove the heat if your skin turns bright red. This is especially important if you are unable to feel pain, heat, or cold. You may have a greater risk of getting burned.  Check your affected lymph glands every day for changes. Check other lymph gland areas as told by your health care provider. Check for changes such as: ? More swelling. ? Sudden increase in size. ? Redness or pain. ? Hardness.  Keep all follow-up visits as told by your health care provider. This is important. Contact a health care provider if you have:  Swelling that gets worse or spreads to other areas.  Problems with breathing.  Lymph glands that: ? Are still swollen after 2 weeks. ? Have suddenly gotten bigger. ? Are red, painful, or hard.  A fever or chills.  Fatigue.  A sore throat.  Pain in your abdomen.  Weight loss.  Night sweats. Get help right away if you have:  Fluid leaking from an enlarged lymph gland.  Severe pain.  Chest pain.  Shortness of breath. Summary  Lymphadenopathy means that your lymph glands are swollen or larger than normal (enlarged).  Lymph glands (also called lymph  nodes) are collections of tissue that filter bacteria, viruses, and waste from  the bloodstream. They are part of your body's disease-fighting system (immune system).  Lymphadenopathy can occur anywhere that you have lymph glands.  If your enlarged and swollen lymph glands do not go back to normal after you have an infection or disease, your health care provider may do tests to monitor your condition and find the reason why the glands are still swollen and enlarged.  Check your affected lymph glands every day for changes. Check other lymph gland areas as told by your health care provider. This information is not intended to replace advice given to you by your health care provider. Make sure you discuss any questions you have with your health care provider. Document Revised: 01/09/2017 Document Reviewed: 12/12/2016 Elsevier Patient Education  2020 Elsevier Inc.      Agustina Caroli, MD [facility]

## 2019-08-22 NOTE — Patient Instructions (Addendum)
If you have lab work done today you will be contacted with your lab results within the next 2 weeks.  If you have not heard from [place] then please contact us. The fastest way to get your results is to register for My Chart.   IF you received an x-ray today, you will receive an invoice from [facility]. Please contact [facility] at [phone] with questions or concerns regarding your invoice.   IF you received labwork today, you will receive an invoice from [place]. Please contact [facility] at [phone] with questions or concerns regarding your invoice.   Our billing staff will not be able to assist you with questions regarding bills from these companies.  You will be contacted with the lab results as soon as they are available. The fastest way to get your results is to activate your My Chart account. Instructions are located on the last page of this paperwork. If you have not heard from [place] regarding the results in 2 weeks, please contact this office.     Lymphadenopathy  Lymphadenopathy means that your lymph glands are swollen or larger than normal (enlarged). Lymph glands, also called lymph nodes, are collections of tissue that filter bacteria, viruses, and waste from your bloodstream. They are part of your body's disease-fighting system (immune system), which protects your body from germs. There may be different causes of lymphadenopathy, depending on where it is in your body. Some types go away on their own. Lymphadenopathy can occur anywhere that you have lymph glands, including these areas:  Neck (cervical lymphadenopathy).  Chest (mediastinal lymphadenopathy).  Lungs (hilar lymphadenopathy).  Underarms (axillary lymphadenopathy).  Groin (inguinal lymphadenopathy). When your immune system responds to germs, infection-fighting cells and fluid build up in your lymph glands. This causes some swelling and enlargement. If the lymph glands do not go back to  normal after you have an infection or disease, your health care provider may do tests. These tests help to monitor your condition and find the reason why the glands are still swollen and enlarged. Follow these instructions at home:  Get plenty of rest.  Take over-the-counter and prescription medicines only as told by your health care provider. Your health care provider may recommend over-the-counter medicines for pain.  If directed, apply heat to swollen lymph glands as often as told by your health care provider. Use the heat source that your health care provider recommends, such as a moist heat pack or a heating pad. ? Place a towel between your skin and the heat source. ? Leave the heat on for 20-30 minutes. ? Remove the heat if your skin turns bright red. This is especially important if you are unable to feel pain, heat, or cold. You may have a greater risk of getting burned.  Check your affected lymph glands every day for changes. Check other lymph gland areas as told by your health care provider. Check for changes such as: ? More swelling. ? Sudden increase in size. ? Redness or pain. ? Hardness.  Keep all follow-up visits as told by your health care provider. This is important. Contact a health care provider if you have:  Swelling that gets worse or spreads to other areas.  Problems with breathing.  Lymph glands that: ? Are still swollen after 2 weeks. ? Have suddenly gotten bigger. ? Are red, painful, or hard.  A fever or chills.  Fatigue.  A sore throat.  Pain in your abdomen.  Weight loss.  Night sweats. Get  help right away if you have:  Fluid leaking from an enlarged lymph gland.  Severe pain.  Chest pain.  Shortness of breath. Summary  Lymphadenopathy means that your lymph glands are swollen or larger than normal (enlarged).  Lymph glands (also called lymph nodes) are collections of tissue that filter bacteria, viruses, and waste from the bloodstream.  They are part of your body's disease-fighting system (immune system).  Lymphadenopathy can occur anywhere that you have lymph glands.  If your enlarged and swollen lymph glands do not go back to normal after you have an infection or disease, your health care provider may do tests to monitor your condition and find the reason why the glands are still swollen and enlarged.  Check your affected lymph glands every day for changes. Check other lymph gland areas as told by your health care provider. This information is not intended to replace advice given to you by your health care provider. Make sure you discuss any questions you have with your health care provider. Document Revised: 01/09/2017 Document Reviewed: 12/12/2016 Elsevier Patient Education  2020 [employer].

## 2020-08-16 ENCOUNTER — Other Ambulatory Visit: Payer: Self-pay

## 2020-08-16 ENCOUNTER — Ambulatory Visit (INDEPENDENT_AMBULATORY_CARE_PROVIDER_SITE_OTHER): Payer: Commercial Managed Care - PPO | Admitting: Family Medicine

## 2020-08-16 ENCOUNTER — Encounter: Payer: Self-pay | Admitting: Family Medicine

## 2020-08-16 VITALS — BP 128/84 | HR 78 | Temp 98.0°F | Resp 16 | Ht 70.0 in | Wt 223.6 lb

## 2020-08-16 DIAGNOSIS — Z7185 Encounter for immunization safety counseling: Secondary | ICD-10-CM | POA: Diagnosis not present

## 2020-08-16 DIAGNOSIS — T63441A Toxic effect of venom of bees, accidental (unintentional), initial encounter: Secondary | ICD-10-CM

## 2020-08-16 NOTE — Patient Instructions (Addendum)
I do recommend 2nd covid vaccine booster when able.  You are also due for pneumonia and shingles vaccines - those can be given here if you would like.   I suspect you did have a bee sting but as it is significantly improving today and no specific treatment is needed.  Over-the-counter Benadryl can be used if needed in future for swelling or itching.  Return to the clinic or go to the nearest emergency room if any of your symptoms worsen or new symptoms occur.   Bee, Wasp, or [employer], Adult Bees, wasps, and hornets are part of a family of insects that can sting people. These stings can cause pain and inflammation, but they are usually not serious. However, some people may have an allergic reaction to a sting. This can causethe symptoms to be more severe. What increases the risk? You may be at a greater risk of getting stung if you: Provoke a stinging insect by swatting or disturbing it. Wear strong-smelling soaps, deodorants, or body sprays. Spend time outdoors near gardens with flowers or fruit trees or in clothes that expose skin. Eat or drink outside. What are the signs or symptoms? Common symptoms of this condition include: A red lump in the skin that sometimes has a tiny hole in the center. In some cases, a stinger may be in the center of the wound. Pain and itching at the sting site. Redness and swelling around the sting site. If you have an allergic reaction (localized allergic reaction), the swelling and redness may spread out from the sting site. In some cases, this reaction can continue to develop over the next 24-48 hours. In rare cases, a person may have a severe allergic reaction (anaphylactic reaction) to a sting. Symptoms of an anaphylactic reaction may include: Wheezing or difficulty breathing. Raised, itchy, red patches on the skin (hives). Nausea or vomiting. Abdominal cramping. Diarrhea. Tightness in the chest or chest pain. Dizziness or fainting. Redness of the  face (flushing). Hoarse voice. Swollen tongue, lips, or face. How is this diagnosed? This condition is usually diagnosed based on your symptoms and medical history as well as a physical exam. You may have an allergy test to determine if you are allergic to the substance that the insect injected during the sting (venom). How is this treated? If you were stung by a bee, the stinger and a small sac of venom may be in the wound. It is important to remove the stinger as soon as possible. You can do this by brushing across the wound with gauze, a fingernail, or a flat card such as a credit card. Removing the stinger can help reduce the severity of yourbody's reaction to the sting. Most stings can be treated with: Icing to reduce swelling in the area. Medicines (antihistamines) to treat itching or an allergic reaction. Medicines to help reduce pain. These may be medicines that you take by mouth, or medicated creams or lotions that you apply to your skin. Pay close attention to your symptoms after you have been stung. If possible, have someone stay with you to make sure you do not have an allergic reaction. If you have any signs of an allergic reaction, call your health care provider. If you have ever had a severe allergic reaction, your health care provider may give you an inhaler or injectable medicine (epinephrine auto-injector) to use if necessary. Follow these instructions at home:  Wash the sting site 2-3 times each day with soap and water as told by  your health care provider. Apply or take over-the-counter and prescription medicines only as told by your health care provider. If directed, apply ice to the sting area. Put ice in a plastic bag. Place a towel between your skin and the bag. Leave the ice on for 20 minutes, 2-3 times a day. Do not scratch the sting area. If you had a severe allergic reaction to a sting, you may need: To wear a medical bracelet or necklace that lists the allergy. To  learn when and how to use an anaphylaxis kit or epinephrine injection. Your family members and coworkers may also need to learn this. To carry an anaphylaxis kit or epinephrine injection with you at all times. How is this prevented? Avoid swatting at stinging insects and disturbing insect nests. Do not use fragrant soaps or lotions. Wear shoes, pants, and long sleeves when spending time outdoors, especially in grassy areas where stinging insects are common. Keep outdoor areas free from nests or hives. Keep food and drink containers covered when eating outdoors. Avoid working or sitting near [employer], if possible. Wear gloves if you are gardening or working outdoors. If an attack by a stinging insect or a swarm seems likely in the moment, move away from the area or find a barrier between you and the insect(s), such as a door. Contact a health care provider if: Your symptoms do not get better in 2-3 days. You have redness, swelling, or pain that spreads beyond the area of the sting. You have a fever. Get help right away if: You have symptoms of a severe allergic reaction. These include: Wheezing or difficulty breathing. Tightness in the chest or chest pain. Light-headedness or fainting. Itchy, raised, red patches on the skin. Nausea or vomiting. Abdominal cramping. Diarrhea. A swollen tongue or lips, or trouble swallowing. Dizziness or fainting. Summary Stings from bees, wasps, and hornets can cause pain and inflammation, but they are usually not serious. However, some people may have an allergic reaction to a sting. This can cause the symptoms to be more severe. Pay close attention to your symptoms after you have been stung. If possible, have someone stay with you to make sure you do not have an allergic reaction. Call your health care provider if you have any signs of an allergic reaction. This information is not intended to replace advice given to you by your health care  provider. Make sure you discuss any questions you have with your healthcare provider. Document Revised: 11/22/2019 Document Reviewed: 11/22/2019 Elsevier Patient Education  2022 [employer].

## 2020-08-16 NOTE — Progress Notes (Signed)
Subjective:  Patient ID: Charles [name], male    DOB: [birth year]  Age: 65 y.o. MRN: [id number]  CC:  Chief Complaint  Patient presents with   Insect Bite    Pt has a bite or sting on Lt hand small finger, got over the weekend and had swelling yesterday, initially had sharp pain for one hour. Mostly better today    Immunizations    Discuss pneumonia vaccine and Shingrix.     HPI Charles [name] presents for   Bee sting: L 5th finger - bee or spider, 2 mornings ago. Small black bug fell when bit/stung while mowing grass. Initial pain for 1 hr. Increased swelling of finger yesterday to top of hand and down finger.    Swelling notably better today. 50% better. Bending better today.  No hives, difficulty swallowing, respiratory difficulty/wheezing. No bee sting allergy.  Tx: ice, salt water. Improved swelling.   Immunization review: Immunization History  Administered Date(s) Administered   Influenza,inj,Quad PF,6+ Mos 11/03/2016   Influenza-Unspecified 12/26/2014   PFIZER(Purple Top)SARS-COV-2 Vaccination 04/20/2019, 05/11/2019   Tdap 01/31/2017  Due for pneumonia vaccination (history of HIV infection):  would like to discuss with ID first.  Due for shingles vaccination: no prior vaccine, had disease.  Declines today as well.  COVID booster x1 in December.    History Patient Active Problem List   Diagnosis Date Noted   Personal history of colonic adenoma 02/24/2013   HIV (human immunodeficiency virus infection) ([place]) 05/01/2011   Past Medical History:  Diagnosis Date   Arthritis    fingers,left knee   Hemorrhoids, internal, with bleeding 06/23/2018   HIV infection ([place])    [employer] 08/02/2019   HIV positive ([place])    Personal history of colonic adenoma 02/24/2013   Past Surgical History:  Procedure Laterality Date   COLONOSCOPY     HEMORRHOID BANDING     PROSTATE BIOPSY     TONSILLECTOMY     at age 66   Allergies  Allergen Reactions   Efavirenz Rash    Emtricitabine-Tenofovir Df Rash   Prior to Admission medications   Medication Sig Start Date End Date Taking? Authorizing Provider  abacavir-dolutegravir-lamiVUDine (TRIUMEQ) 600-50-300 MG tablet Take 1 tablet by mouth daily.   Yes [provider]  Ascorbic Acid (VITAMIN C) 1000 MG tablet Take 1,000 mg by mouth daily.   Yes [provider]  Boswellia-Glucosamine-Vit D (OSTEO BI-FLEX ONE PER DAY PO) Take 2 tablets by mouth daily.    Yes [provider]  Multiple Vitamin (MULTIVITAMIN) tablet Take 1 tablet by mouth daily.   Yes [provider]  valACYclovir (VALTREX) 1000 MG tablet Take 1 tablet (1,000 mg total) by mouth daily. For 5 days per occurrence. 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  Constitutional:      General: He  is not in acute distress.    Appearance: Normal appearance. He is well-developed.  HENT:     Head: Normocephalic and atraumatic.  Cardiovascular:     Rate and Rhythm: Normal rate.  Pulmonary:     Effort: Pulmonary effort is normal.  Musculoskeletal:     Comments: Left hand, minimal soft tissue swelling over the dorsal MCP to proximal phalanx without erythema or wound identified.  Intact range of motion of MCP, IP joints.  No appreciable erythema  Neurological:     Mental Status: He is alert and oriented to person, place, and time.  Psychiatric:        Mood and Affect: Mood normal.       Assessment & Plan:  Charles [name] is a 65 y.o. male . Bee sting, accidental or unintentional, initial encounter  -Likely bee sting versus other stinging insect with initial normal response of pain, erythema, swelling and tingling.  Symptoms are now improving and have sniffily improved since yesterday.  No systemic symptoms.  Continue symptomatic care if needed with RTC precautions, handout given.  Immunization counseling  -Recommended Pneumovax, Shingrix.  He would like to discuss pneumonia vaccine with his infectious disease specialist to make sure he has not received it there.  He would also like to defer shingles vaccine at this time.  No orders of the defined types were placed in this encounter.  Patient Instructions  I do recommend 2nd covid vaccine booster when able.  You are also due for pneumonia and shingles vaccines - those can be given here if you would like.   I suspect you did have a bee sting but as it is significantly improving today and no specific treatment is needed.  Over-the-counter Benadryl can be used if needed in future for swelling or itching.  Return to the clinic or go to the nearest emergency room if any of your symptoms worsen or new symptoms occur.   Bee, Wasp, or [employer], Adult Bees, wasps, and hornets are part of a family of insects that can sting people.  These stings can cause pain and inflammation, but they are usually not serious. However, some people may have an allergic reaction to a sting. This can causethe symptoms to be more severe. What increases the risk? You may be at a greater risk of getting stung if you: Provoke a stinging insect by swatting or disturbing it. Wear strong-smelling soaps, deodorants, or body sprays. Spend time outdoors near gardens with flowers or fruit trees or in clothes that expose skin. Eat or drink outside. What are the signs or symptoms? Common symptoms of this condition include: A red lump in the skin that sometimes has a tiny hole in the center. In some cases, a stinger may be  in the center of the wound. Pain and itching at the sting site. Redness and swelling around the sting site. If you have an allergic reaction (localized allergic reaction), the swelling and redness may spread out from the sting site. In some cases, this reaction can continue to develop over the next 24-48 hours. In rare cases, a person may have a severe allergic reaction (anaphylactic reaction) to a sting. Symptoms of an anaphylactic reaction may include: Wheezing or difficulty breathing. Raised, itchy, red patches on the skin (hives). Nausea or vomiting. Abdominal cramping. Diarrhea. Tightness in the chest or chest pain. Dizziness or fainting. Redness of the face (flushing). Hoarse voice. Swollen tongue, lips, or face. How is this diagnosed? This condition is usually diagnosed based on your symptoms and medical history as well as a physical exam. You may have an allergy test to determine if you are allergic to the substance that the insect injected during the sting (venom). How is this treated? If you were stung by a bee, the stinger and a small sac of venom may be in the wound. It is important to remove the stinger as soon as possible. You can do this by brushing across the wound with gauze, a fingernail, or a flat card such as a  credit card. Removing the stinger can help reduce the severity of yourbody's reaction to the sting. Most stings can be treated with: Icing to reduce swelling in the area. Medicines (antihistamines) to treat itching or an allergic reaction. Medicines to help reduce pain. These may be medicines that you take by mouth, or medicated creams or lotions that you apply to your skin. Pay close attention to your symptoms after you have been stung. If possible, have someone stay with you to make sure you do not have an allergic reaction. If you have any signs of an allergic reaction, call your health care provider. If you have ever had a severe allergic reaction, your health care provider may give you an inhaler or injectable medicine (epinephrine auto-injector) to use if necessary. Follow these instructions at home:  Wash the sting site 2-3 times each day with soap and water as told by your health care provider. Apply or take over-the-counter and prescription medicines only as told by your health care provider. If directed, apply ice to the sting area. Put ice in a plastic bag. Place a towel between your skin and the bag. Leave the ice on for 20 minutes, 2-3 times a day. Do not scratch the sting area. If you had a severe allergic reaction to a sting, you may need: To wear a medical bracelet or necklace that lists the allergy. To learn when and how to use an anaphylaxis kit or epinephrine injection. Your family members and coworkers may also need to learn this. To carry an anaphylaxis kit or epinephrine injection with you at all times. How is this prevented? Avoid swatting at stinging insects and disturbing insect nests. Do not use fragrant soaps or lotions. Wear shoes, pants, and long sleeves when spending time outdoors, especially in grassy areas where stinging insects are common. Keep outdoor areas free from nests or hives. Keep food and drink containers covered when eating outdoors. Avoid working  or sitting near [employer], if possible. Wear gloves if you are gardening or working outdoors. If an attack by a stinging insect or a swarm seems likely in the moment, move away from the area or find a barrier between you and the insect(s), such as a door. Contact  a health care provider if: Your symptoms do not get better in 2-3 days. You have redness, swelling, or pain that spreads beyond the area of the sting. You have a fever. Get help right away if: You have symptoms of a severe allergic reaction. These include: Wheezing or difficulty breathing. Tightness in the chest or chest pain. Light-headedness or fainting. Itchy, raised, red patches on the skin. Nausea or vomiting. Abdominal cramping. Diarrhea. A swollen tongue or lips, or trouble swallowing. Dizziness or fainting. Summary Stings from bees, wasps, and hornets can cause pain and inflammation, but they are usually not serious. However, some people may have an allergic reaction to a sting. This can cause the symptoms to be more severe. Pay close attention to your symptoms after you have been stung. If possible, have someone stay with you to make sure you do not have an allergic reaction. Call your health care provider if you have any signs of an allergic reaction. This information is not intended to replace advice given to you by your health care provider. Make sure you discuss any questions you have with your healthcare provider. Document Revised: 11/22/2019 Document Reviewed: 11/22/2019 Elsevier Patient Education  2022 [place],   Merri Ray, MD [place] 08/16/20 8:34 AM

## 2021-02-18 ENCOUNTER — Encounter: Payer: Commercial Managed Care - PPO | Admitting: Family Medicine

## 2022-08-21 ENCOUNTER — Emergency Department (HOSPITAL_COMMUNITY): Payer: Commercial Managed Care - PPO

## 2022-08-21 ENCOUNTER — Encounter (HOSPITAL_COMMUNITY): Payer: Self-pay | Admitting: Emergency Medicine

## 2022-08-21 ENCOUNTER — Emergency Department (HOSPITAL_COMMUNITY)
Admission: EM | Admit: 2022-08-21 | Discharge: 2022-08-21 | Disposition: A | Discharge status: Home / Self Care | Payer: Commercial Managed Care - PPO | Attending: Emergency Medicine | Admitting: Emergency Medicine

## 2022-08-21 DIAGNOSIS — I10 Essential (primary) hypertension: Secondary | ICD-10-CM | POA: Diagnosis present

## 2022-08-21 DIAGNOSIS — D696 Thrombocytopenia, unspecified: Secondary | ICD-10-CM | POA: Diagnosis not present

## 2022-08-21 DIAGNOSIS — Z79899 Other long term (current) drug therapy: Secondary | ICD-10-CM | POA: Diagnosis not present

## 2022-08-21 DIAGNOSIS — E876 Hypokalemia: Secondary | ICD-10-CM | POA: Insufficient documentation

## 2022-08-21 LAB — BASIC METABOLIC PANEL
Anion gap: 7 (ref 5–15)
BUN: 21 mg/dL (ref 8–23)
CO2: 23 mmol/L (ref 22–32)
Calcium: 8.6 mg/dL — ABNORMAL LOW (ref 8.9–10.3)
Chloride: 108 mmol/L (ref 98–111)
Creatinine, Ser: 1.02 mg/dL (ref 0.61–1.24)
GFR, Estimated: >60 mL/min (ref >60)
Glucose, Bld: 91 mg/dL (ref 70–99)
Potassium: 3.4 mmol/L — ABNORMAL LOW (ref 3.5–5.1)
Sodium: 138 mmol/L (ref 135–145)

## 2022-08-21 LAB — CBC
HCT: 42.7 % (ref 39.0–52.0)
Hemoglobin: 14.2 g/dL (ref 13.0–17.0)
MCH: 32 pg (ref 26.0–34.0)
MCHC: 33.3 g/dL (ref 30.0–36.0)
MCV: 96.2 fL (ref 80.0–100.0)
Platelets: 142 10*3/uL — ABNORMAL LOW (ref 150–400)
RBC: 4.44 MIL/uL (ref 4.22–5.81)
RDW: 12.8 % (ref 11.5–15.5)
WBC: 5.6 10*3/uL (ref 4.0–10.5)
nRBC: 0 % (ref 0.0–0.2)

## 2022-08-21 MED ORDER — AMLODIPINE BESYLATE 2.5 MG PO TABS: 2.5000 mg | ORAL_TABLET | Freq: Every day | ORAL | 0 refills | Status: DC

## 2022-08-21 MED ORDER — POTASSIUM CHLORIDE 20 MEQ PO PACK
20.0000 meq | PACK | Freq: Once | ORAL | Status: AC
  Administered 2022-08-21: 20 meq via ORAL
  Filled 2022-08-21: qty 1

## 2022-08-21 NOTE — ED Triage Notes (Signed)
Pt reports HTN since Tuesday when he went to dentist. Denies medical hx of HTN. Denies blurred vision, SOB or CP. Mild headache started about 15 mins ago. Last saw PCP in Feb and normal BP.

## 2022-08-21 NOTE — Discharge Instructions (Signed)
You were seen today for high blood pressure.  Your lab work here was notable only for slightly low potassium and calcium.  You should follow close with your doctor for further evaluation.  If your blood pressure remains high for several days you can start the prescribed blood pressure medication.  If you develop chest pain, severe headache, weakness or numbness or difficulty breathing you should return to the ED.

## 2022-08-21 NOTE — ED Provider Notes (Signed)
[place] EMERGENCY DEPARTMENT AT [facility] Provider Note   CSN: [id number] Arrival date & time: 08/21/22  1447     History  Chief Complaint  Patient presents with   Hypertension    Charles [name] is a 67 y.o. male.   Hypertension  67 year old male presenting for hypertension.  Patient states he had a dental procedure on Tuesday.  His blood pressure at that time was elevated 135/91 which is higher than his normally is.  He brought a blood pressure cuff and has been checking at home.  He had a couple measurements in the 160s at home so he came in for evaluation.  He has been asymptomatic.  He had a very mild brief frontal headache earlier similar to prior headaches which was not sudden onset and not severe.  He otherwise has been completely asymptomatic.  No chest pain or dizziness or shortness of breath.  No abdominal pain.  No vision changes or weakness or numbness.  He has not been on blood pressure medications before.     Home Medications Prior to Admission medications   Medication Sig Start Date End Date Taking? Authorizing Provider  amLODipine (NORVASC) 2.5 MG tablet Take 1 tablet (2.5 mg total) by mouth daily. 08/21/22  Yes Laurence Spates, MD  abacavir-dolutegravir-lamiVUDine (TRIUMEQ) 600-50-300 MG tablet Take 1 tablet by mouth daily.    [provider]  Ascorbic Acid (VITAMIN C) 1000 MG tablet Take 1,000 mg by mouth daily.    [provider]  Boswellia-Glucosamine-Vit D (OSTEO BI-FLEX ONE PER DAY PO) Take 2 tablets by mouth daily.     [provider]  Multiple Vitamin (MULTIVITAMIN) tablet Take 1 tablet by mouth daily.    [provider]  valACYclovir (VALTREX) 1000 MG tablet Take 1 tablet (1,000 mg total) by mouth daily. For 5 days per occurrence. 11/03/16   Shade Flood, MD      Allergies    Efavirenz and Emtricitabine-tenofovir df    Review of Systems   Review of Systems Review of systems completed and  notable as per HPI.  ROS otherwise negative.   Physical Exam Updated Vital Signs BP (!) 126/94   Pulse 60   Temp 98.4 F (36.9 C) (Oral)   Resp 18   Ht 5\' 10"  (1.778 m)   Wt 101 kg   SpO2 92%   BMI 31.95 kg/m  Physical Exam Vitals and nursing note reviewed.  Constitutional:      General: He is not in acute distress.    Appearance: He is well-developed.  HENT:     Head: Normocephalic and atraumatic.  Eyes:     Conjunctiva/sclera: Conjunctivae normal.  Cardiovascular:     Rate and Rhythm: Normal rate and regular rhythm.     Heart sounds: No murmur heard. Pulmonary:     Effort: Pulmonary effort is normal. No respiratory distress.     Breath sounds: Normal breath sounds.  Abdominal:     Palpations: Abdomen is soft.     Tenderness: There is no abdominal tenderness.  Musculoskeletal:        General: No swelling.     Cervical back: Neck supple.     Right lower leg: No edema.     Left lower leg: No edema.  Skin:    General: Skin is warm and dry.     Capillary Refill: Capillary refill takes less than 2 seconds.  Neurological:     General: No focal deficit present.  Mental Status: He is alert and oriented to person, place, and time. Mental status is at baseline.  Psychiatric:        Mood and Affect: Mood normal.     ED Results / Procedures / Treatments   Labs (all labs ordered are listed, but only abnormal results are displayed) Labs Reviewed  BASIC METABOLIC PANEL - Abnormal; Notable for the following components:      Result Value   Potassium 3.4 (*)    Calcium 8.6 (*)    All other components within normal limits  CBC - Abnormal; Notable for the following components:   Platelets 142 (*)    All other components within normal limits    EKG EKG Interpretation Date/Time:  Thursday August 21 2022 14:56:00 EDT Ventricular Rate:  81 PR Interval:  159 QRS Duration:  92 QT Interval:  388 QTC Calculation: 451 R Axis:   -20  Text Interpretation: Sinus rhythm  Inferior infarct, old Baseline wander in lead(s) III no acute ST/T changes No old tracing to compare Confirmed by Pricilla Loveless [phone]) on 08/21/2022 3:26:47 PM  Radiology DG Chest Port 1 View  Result Date: 08/21/2022 CLINICAL DATA:  Chest pain. EXAM: PORTABLE CHEST 1 VIEW COMPARISON:  March 09, 2017. FINDINGS: The heart size and mediastinal contours are within normal limits. Both lungs are clear. The visualized skeletal structures are unremarkable. IMPRESSION: No active disease. Electronically Signed   By: Lupita Raider M.D.   On: 08/21/2022 15:54    Procedures Procedures    Medications Ordered in ED Medications  potassium chloride (KLOR-CON) packet 20 mEq (20 mEq Oral Given 08/21/22 1706)    ED Course/ Medical Decision Making/ A&P                             Medical Decision Making Amount and/or Complexity of Data Reviewed Labs: ordered.  Risk Prescription drug management.   Medical Decision Making:   Charles [name] is a 67 y.o. male who presented to the ED today with asymptomatic hypertension.  He was quite hypertensive in triage on my evaluation his hypertension is only mild.  He is well-appearing, asymptomatic for mild headache earlier but it was similar to prior headaches does not seem consistent with subarachnoid hemorrhage, aneurysm, stroke.  EKG sinus rhythm no signs of acute ischemia.  He has no chest pain or ACS symptoms have low concern for ACS at this time.  No shortness of breath.  I have low concern for acute endorgan damage to suggest hypertensive emergency.  Obtain basic lab workup and plan for likely outpatient management.  The chest x-ray was obtained in triage, I do not see any acute abnormality.  Reviewed and confirmed nursing documentation for past medical history, family history, social history.  Initial Study Results:   Laboratory  All laboratory results reviewed.  Labs notable for K of 3.4, calcium 8.6, platelets 142.  EKG EKG was reviewed  independently. Rate, rhythm, axis, intervals all examined and without medically relevant abnormality. ST segments without concerns for elevations.    Radiology:  All images reviewed independently. Agree with radiology report at this time.    Reassessment and Plan:   On reassessment patient remains asymptomatic.  Blood pressure is improved without intervention.  I do not see any signs of endorgan damage to suggest hypertensive emergency.  I discussed the patient that I recommend he continue monitoring his blood pressure at home intermittently and follow closely with his  doctor.  I given prescription for amlodipine, and discussed that if his blood pressure remains high persistently he could start this medication and should follow-up with his PCP regardless.  I told him not to start it if his blood pressure remains normal.  I did notify him of incidental findings of low calcium, low potassium, low platelets and recommended he follow-up with a PCP to have these rechecked.  He was discharged with return precautions.   Patient's presentation is most consistent with acute complicated illness / injury requiring diagnostic workup.           Final Clinical Impression(s) / ED Diagnoses Final diagnoses:  Hypertension, unspecified type    Rx / DC Orders ED Discharge Orders          Ordered    amLODipine (NORVASC) 2.5 MG tablet  Daily        08/21/22 1657              Laurence Spates, MD 08/21/22 1801

## 2022-10-02 ENCOUNTER — Encounter: Payer: Self-pay | Admitting: Family Medicine

## 2022-10-02 ENCOUNTER — Ambulatory Visit (INDEPENDENT_AMBULATORY_CARE_PROVIDER_SITE_OTHER): Payer: Commercial Managed Care - PPO | Admitting: Family Medicine

## 2022-10-02 VITALS — BP 130/82 | HR 76 | Temp 98.7°F | Ht 68.5 in | Wt 223.4 lb

## 2022-10-02 DIAGNOSIS — Z0000 Encounter for general adult medical examination without abnormal findings: Secondary | ICD-10-CM | POA: Diagnosis not present

## 2022-10-02 DIAGNOSIS — I10 Essential (primary) hypertension: Secondary | ICD-10-CM

## 2022-10-02 NOTE — Patient Instructions (Addendum)
If BP remains over 130/80 - can take 5mg  total amlodipine. Let me know if you make that change. See info on BP below.   Please send me recent labs and can decide if other testing needed.   I do recommend meeting with eye specialist including screening for retina changes with your blood pressure.   Take care!  Plantar Fasciitis  Plantar fasciitis is a painful foot condition that affects the heel. It occurs when the band of tissue that connects the toes to the heel bone (plantar fascia) becomes irritated. This can happen as the result of exercising too much or doing other repetitive activities (overuse injury). Plantar fasciitis can cause mild irritation to severe pain that makes it difficult to walk or move. The pain is usually worse in the morning after sleeping, or after sitting or lying down for a period of time. Pain may also be worse after long periods of walking or standing. What are the causes? This condition may be caused by: Standing for long periods of time. Wearing shoes that do not have good arch support. Doing activities that put stress on joints (high-impact activities). This includes ballet and exercise that makes your heart beat faster (aerobic exercise), such as running. Being overweight. An abnormal way of walking (gait). Tight muscles in the back of your lower leg (calf). High arches in your feet or flat feet. Starting a new athletic activity. What are the signs or symptoms? The main symptom of this condition is heel pain. Pain may get worse after the following: Taking the first steps after a time of rest, especially in the morning after awakening, or after you have been sitting or lying down for a while. Long periods of standing still. Pain may decrease after 30-45 minutes of activity, such as gentle walking. How is this diagnosed? This condition may be diagnosed based on your medical history, a physical exam, and your symptoms. Your health care provider will check  for: A tender area on the bottom of your foot. A high arch in your foot or flat feet. Pain when you move your foot. Difficulty moving your foot. You may have imaging tests to confirm the diagnosis, such as: X-rays. Ultrasound. MRI. How is this treated? Treatment for plantar fasciitis depends on how severe your condition is. Treatment may include: Rest, ice, pressure (compression), and raising (elevating) the affected foot. This is called RICE therapy. Your health care provider may recommend RICE therapy along with over-the-counter pain medicines to manage your pain. Exercises to stretch your calves and your plantar fascia. A splint that holds your foot in a stretched, upward position while you sleep (night splint). Physical therapy to relieve symptoms and prevent problems in the future. Injections of steroid medicine (cortisone) to relieve pain and inflammation. Stimulating your plantar fascia with electrical impulses (extracorporeal shock wave therapy). This is usually the last treatment option before surgery. Surgery, if other treatments have not worked after 12 months. Follow these instructions at home: Managing pain, stiffness, and swelling  If directed, put ice on the painful area. To do this: Put ice in a plastic bag, or use a frozen bottle of water. Place a towel between your skin and the bag or bottle. Roll the bottom of your foot over the bag or bottle. Do this for 20 minutes, 2-3 times a day. Wear athletic shoes that have air-sole or gel-sole cushions, or try soft shoe inserts that are designed for plantar fasciitis. Elevate your foot above the level of your heart while  you are sitting or lying down. Activity Avoid activities that cause pain. Ask your health care provider what activities are safe for you. Do physical therapy exercises and stretches as told by your health care provider. Try activities and forms of exercise that are easier on your joints (low impact). Examples  include swimming, water aerobics, and biking. General instructions Take over-the-counter and prescription medicines only as told by your health care provider. Wear a night splint while sleeping, if told by your health care provider. Loosen the splint if your toes tingle, become numb, or turn cold and blue. Maintain a healthy weight, or work with your health care provider to lose weight as needed. Keep all follow-up visits. This is important. Contact a health care provider if you have: Symptoms that do not go away with home treatment. Pain that gets worse. Pain that affects your ability to move or do daily activities. Summary Plantar fasciitis is a painful foot condition that affects the heel. It occurs when the band of tissue that connects the toes to the heel bone (plantar fascia) becomes irritated. Heel pain is the main symptom of this condition. It may get worse after exercising too much or standing still for a long time. Treatment varies, but it usually starts with rest, ice, pressure (compression), and raising (elevating) the affected foot. This is called RICE therapy. Over-the-counter medicines can also be used to manage pain. This information is not intended to replace advice given to you by your health care provider. Make sure you discuss any questions you have with your health care provider. Document Revised: 05/16/2019 Document Reviewed: 05/16/2019 Elsevier Patient Education  2024 [employer].   Preventive Care 57-82 Years Old, Male Preventive care refers to lifestyle choices and visits with your health care provider that can promote health and wellness. Preventive care visits are also called wellness exams. What can I expect for my preventive care visit? Counseling During your preventive care visit, your health care provider may ask about your: Medical history, including: Past medical problems. Family medical history. Current health, including: Emotional well-being. Home  life and relationship well-being. Sexual activity. Lifestyle, including: Alcohol, nicotine or tobacco, and drug use. Access to firearms. Diet, exercise, and sleep habits. Safety issues such as seatbelt and bike helmet use. Sunscreen use. Work and work Astronomer. Physical exam Your health care provider will check your: Height and weight. These may be used to calculate your BMI (body mass index). BMI is a measurement that tells if you are at a healthy weight. Waist circumference. This measures the distance around your waistline. This measurement also tells if you are at a healthy weight and may help predict your risk of certain diseases, such as type 2 diabetes and high blood pressure. Heart rate and blood pressure. Body temperature. Skin for abnormal spots. What immunizations do I need?  Vaccines are usually given at various ages, according to a schedule. Your health care provider will recommend vaccines for you based on your age, medical history, and lifestyle or other factors, such as travel or where you work. What tests do I need? Screening Your health care provider may recommend screening tests for certain conditions. This may include: Lipid and cholesterol levels. Diabetes screening. This is done by checking your blood sugar (glucose) after you have not eaten for a while (fasting). Hepatitis B test. Hepatitis C test. HIV (human immunodeficiency virus) test. STI (sexually transmitted infection) testing, if you are at risk. Lung cancer screening. Prostate cancer screening. Colorectal cancer screening. Talk with  your health care provider about your test results, treatment options, and if necessary, the need for more tests. Follow these instructions at home: Eating and drinking  Eat a diet that includes fresh fruits and vegetables, whole grains, lean protein, and low-fat dairy products. Take vitamin and mineral supplements as recommended by your health care provider. Do not  drink alcohol if your health care provider tells you not to drink. If you drink alcohol: Limit how much you have to 0-2 drinks a day. Know how much alcohol is in your drink. In the [place] one drink equals one 12 oz bottle of beer (355 mL), one 5 oz glass of wine (148 mL), or one 1 oz glass of hard liquor (44 mL). Lifestyle Brush your teeth every morning and night with fluoride toothpaste. Floss one time each day. Exercise for at least 30 minutes 5 or more days each week. Do not use any products that contain nicotine or tobacco. These products include cigarettes, chewing tobacco, and vaping devices, such as e-cigarettes. If you need help quitting, ask your health care provider. Do not use drugs. If you are sexually active, practice safe sex. Use a condom or other form of protection to prevent STIs. Take aspirin only as told by your health care provider. Make sure that you understand how much to take and what form to take. Work with your health care provider to find out whether it is safe and beneficial for you to take aspirin daily. Find healthy ways to manage stress, such as: Meditation, yoga, or listening to music. Journaling. Talking to a trusted person. Spending time with friends and family. Minimize exposure to UV radiation to reduce your risk of skin cancer. Safety Always wear your seat belt while driving or riding in a vehicle. Do not drive: If you have been drinking alcohol. Do not ride with someone who has been drinking. When you are tired or distracted. While texting. If you have been using any mind-altering substances or drugs. Wear a helmet and other protective equipment during sports activities. If you have firearms in your house, make sure you follow all gun safety procedures. What's next? Go to your health care provider once a year for an annual wellness visit. Ask your health care provider how often you should have your eyes and teeth checked. Stay up to date on all  vaccines. This information is not intended to replace advice given to you by your health care provider. Make sure you discuss any questions you have with your health care provider. Document Revised: 07/25/2020 Document Reviewed: 07/25/2020 Elsevier Patient Education  2024 [employer]   Managing Your Hypertension Hypertension, also called high blood pressure, is when the force of the blood pressing against the walls of the arteries is too strong. Arteries are blood vessels that carry blood from your heart throughout your body. Hypertension forces the heart to work harder to pump blood and may cause the arteries to become narrow or stiff. Understanding blood pressure readings A blood pressure reading includes a higher number over a lower number: The first, or top, number is called the systolic pressure. It is a measure of the pressure in your arteries as your heart beats. The second, or bottom number, is called the diastolic pressure. It is a measure of the pressure in your arteries as the heart relaxes. For most people, a normal blood pressure is below 120/80. Your personal target blood pressure may vary depending on your medical conditions, your age, and other factors. Blood pressure  is classified into four stages. Based on your blood pressure reading, your health care provider may use the following stages to determine what type of treatment you need, if any. Systolic pressure and diastolic pressure are measured in a unit called millimeters of mercury (mmHg). Normal Systolic pressure: below 120. Diastolic pressure: below 80. Elevated Systolic pressure: 120-129. Diastolic pressure: below 80. Hypertension stage 1 Systolic pressure: 130-139. Diastolic pressure: 80-89. Hypertension stage 2 Systolic pressure: 140 or above. Diastolic pressure: 90 or above. How can this condition affect me? Managing your hypertension is very important. Over time, hypertension can damage the arteries and  decrease blood flow to parts of the body, including the brain, heart, and kidneys. Having untreated or uncontrolled hypertension can lead to: A heart attack. A stroke. A weakened blood vessel (aneurysm). Heart failure. Kidney damage. Eye damage. Memory and concentration problems. Vascular dementia. What actions can I take to manage this condition? Hypertension can be managed by making lifestyle changes and possibly by taking medicines. Your health care provider will help you make a plan to bring your blood pressure within a normal range. You may be referred for counseling on a healthy diet and physical activity. Nutrition  Eat a diet that is high in fiber and potassium, and low in salt (sodium), added sugar, and fat. An example eating plan is called the DASH diet. DASH stands for Dietary Approaches to Stop Hypertension. To eat this way: Eat plenty of fresh fruits and vegetables. Try to fill one-half of your plate at each meal with fruits and vegetables. Eat whole grains, such as whole-wheat pasta, brown rice, or whole-grain bread. Fill about one-fourth of your plate with whole grains. Eat low-fat dairy products. Avoid fatty cuts of meat, processed or cured meats, and poultry with skin. Fill about one-fourth of your plate with lean proteins such as fish, chicken without skin, beans, eggs, and tofu. Avoid pre-made and processed foods. These tend to be higher in sodium, added sugar, and fat. Reduce your daily sodium intake. Many people with hypertension should eat less than 1,500 mg of sodium a day. Lifestyle  Work with your health care provider to maintain a healthy body weight or to lose weight. Ask what an ideal weight is for you. Get at least 30 minutes of exercise that causes your heart to beat faster (aerobic exercise) most days of the week. Activities may include walking, swimming, or biking. Include exercise to strengthen your muscles (resistance exercise), such as weight lifting, as  part of your weekly exercise routine. Try to do these types of exercises for 30 minutes at least 3 days a week. Do not use any products that contain nicotine or tobacco. These products include cigarettes, chewing tobacco, and vaping devices, such as e-cigarettes. If you need help quitting, ask your health care provider. Control any long-term (chronic) conditions you have, such as high cholesterol or diabetes. Identify your sources of stress and find ways to manage stress. This may include meditation, deep breathing, or making time for fun activities. Alcohol use Do not drink alcohol if: Your health care provider tells you not to drink. You are pregnant, may be pregnant, or are planning to become pregnant. If you drink alcohol: Limit how much you have to: 0-1 drink a day for women. 0-2 drinks a day for men. Know how much alcohol is in your drink. In the [place] one drink equals one 12 oz bottle of beer (355 mL), one 5 oz glass of wine (148 mL), or one 1 oz  glass of hard liquor (44 mL). Medicines Your health care provider may prescribe medicine if lifestyle changes are not enough to get your blood pressure under control and if: Your systolic blood pressure is 130 or higher. Your diastolic blood pressure is 80 or higher. Take medicines only as told by your health care provider. Follow the directions carefully. Blood pressure medicines must be taken as told by your health care provider. The medicine does not work as well when you skip doses. Skipping doses also puts you at risk for problems. Monitoring Before you monitor your blood pressure: Do not smoke, drink caffeinated beverages, or exercise within 30 minutes before taking a measurement. Use the bathroom and empty your bladder (urinate). Sit quietly for at least 5 minutes before taking measurements. Monitor your blood pressure at home as told by your health care provider. To do this: Sit with your back straight and supported. Place your feet  flat on the floor. Do not cross your legs. Support your arm on a flat surface, such as a table. Make sure your upper arm is at heart level. Each time you measure, take two or three readings one minute apart and record the results. You may also need to have your blood pressure checked regularly by your health care provider. General information Talk with your health care provider about your diet, exercise habits, and other lifestyle factors that may be contributing to hypertension. Review all the medicines you take with your health care provider because there may be side effects or interactions. Keep all follow-up visits. Your health care provider can help you create and adjust your plan for managing your high blood pressure. Where to find more information National Heart, Lung, and Blood Institute: [employer] American Heart Association: www.heart.org Contact a health care provider if: You think you are having a reaction to medicines you have taken. You have repeated (recurrent) headaches. You feel dizzy. You have swelling in your ankles. You have trouble with your vision. Get help right away if: You develop a severe headache or confusion. You have unusual weakness or numbness, or you feel faint. You have severe pain in your chest or abdomen. You vomit repeatedly. You have trouble breathing. These symptoms may be an emergency. Get help right away. Call 911. Do not wait to see if the symptoms will go away. Do not drive yourself to the hospital. Summary Hypertension is when the force of blood pumping through your arteries is too strong. If this condition is not controlled, it may put you at risk for serious complications. Your personal target blood pressure may vary depending on your medical conditions, your age, and other factors. For most people, a normal blood pressure is less than 120/80. Hypertension is managed by lifestyle changes, medicines, or both. Lifestyle changes to help  manage hypertension include losing weight, eating a healthy, low-sodium diet, exercising more, stopping smoking, and limiting alcohol. This information is not intended to replace advice given to you by your health care provider. Make sure you discuss any questions you have with your health care provider. Document Revised: 10/11/2020 Document Reviewed: 10/11/2020 Elsevier Patient Education  2024 [employer].

## 2022-10-02 NOTE — Progress Notes (Unsigned)
Subjective:  Patient ID: Charles [name], male    DOB: [birth year]  Age: 67 y.o. MRN: [id number]  CC:  Chief Complaint  Patient presents with   Annual Exam    Pt is here for annual exam. Pt is not FASTING. Pt reports he has had some issues with Plantar Fascitis.    HPI Charles [name] presents for Annual Exam. Last visit with me in 2022 for acute illness/bee sting. PCP, me Infectious disease,  Dr. Truman Hayward, appointment in August, HIV on Triumeq.  Hx of plantar fasciitis, has podiatrist if needed. Wearing orthotics.    Hypertension/elevated blood pressure ED visit July 11 for hypertension, elevated BP at dental visit.  Started on amlodipine 2.5 mg at that time if persistent elevated readings.  Taking amlodipine daily. No side effects.  Home readings: 120/85.  Borderline low K on ER visit, now on MVI with potassium.  BP Readings from Last 3 Encounters:  10/02/22 130/82  08/21/22 (!) 126/94  08/16/20 128/84   Lab Results  Component Value Date   CREATININE 1.02 08/21/2022       10/02/2022   12:54 PM 08/16/2020    8:05 AM 08/22/2019   11:10 AM 08/03/2019   12:52 PM 01/03/2019    1:38 PM  Depression screen PHQ 2/9  Decreased Interest 0 0 0 0 0  Down, Depressed, Hopeless 0 0 0 0 0  PHQ - 2 Score 0 0 0 0 0  Altered sleeping 0 0     Tired, decreased energy 0 0     Change in appetite 0 0     Feeling bad or failure about yourself  0 0     Trouble concentrating 0 0     Moving slowly or fidgety/restless 0 0     Suicidal thoughts 0 0     PHQ-9 Score 0 0     Difficult doing work/chores Not difficult at all        Health Maintenance  Topic Date Due   Zoster Vaccines- Shingrix (1 of 2) Never done   COVID-19 Vaccine (3 - Pfizer risk series) 06/08/2019   INFLUENZA VACCINE  09/11/2022   Colonoscopy  04/23/2023   DTaP/Tdap/Td (2 - Td or Tdap) 02/01/2027   Hepatitis C Screening  Completed   HIV Screening  Completed   HPV VACCINES  Aged Out  Colonoscopy 2020 - repeat  2027 Prostate: has PSA at infectious disease - father with hx of prostate CA. Had recent labs  - around 3, prior elevation, had negative bx, and normalized psa.   Immunization History  Administered Date(s) Administered   Influenza,inj,Quad PF,6+ Mos 11/03/2016   Influenza-Unspecified 12/26/2014   PFIZER(Purple Top)SARS-COV-2 Vaccination 04/20/2019, 05/11/2019   Tdap 01/31/2017  Covid vaccine booster - recommended with flu vaccine.  RSV vaccine - declines.  Declines shingrix  Vision Screening   Right eye Left eye Both eyes  Without correction     With correction 20/30 20/25 1 20/20  Recommended optho.   Dental: every 6 months, recent dental work.   Alcohol: none  Tobacco: none  Exercise:walking 2mi per day.    Labs received after office visit.  Collection date of 09/18/2022, hep C antibody nonreactive, RPR reactive with quant 1-1, PSA 1.4, HIV less than 20, QuantiFERON gold negative, chlamydia, gonococcus, trichomonas negative.  Total cholesterol 139, triglycerides 73, HDL 36, LDL 88.  CMP normal with CO2 borderline at 19.  Creatinine normal at 1.18, normal LFTs.  Glucose 97.  CBC normal.  History  Patient Active Problem List   Diagnosis Date Noted   Personal history of colonic adenoma 02/24/2013   HIV (human immunodeficiency virus infection) (HCC) 05/01/2011   Past Medical History:  Diagnosis Date   Arthritis    fingers,left knee   Hemorrhoids, internal, with bleeding 06/23/2018   HIV infection (HCC)    [employer] 08/02/2019   HIV positive (HCC)    Personal history of colonic adenoma 02/24/2013   Plantar fasciitis    Past Surgical History:  Procedure Laterality Date   COLONOSCOPY     HEMORRHOID BANDING     PROSTATE BIOPSY     TONSILLECTOMY     at age 43   Allergies  Allergen Reactions   Efavirenz Rash   Emtricitabine-Tenofovir Df Rash   Prior to Admission medications   Medication Sig Start Date End Date Taking? Authorizing Provider   abacavir-dolutegravir-lamiVUDine (TRIUMEQ) 600-50-300 MG tablet Take 1 tablet by mouth daily.   Yes [provider]  amLODipine (NORVASC) 2.5 MG tablet Take 1 tablet (2.5 mg total) by mouth daily. 08/21/22  Yes Laurence Spates, MD  Ascorbic Acid (VITAMIN C) 1000 MG tablet Take 1,000 mg by mouth daily.   Yes [provider]  Boswellia-Glucosamine-Vit D (OSTEO BI-FLEX ONE PER DAY PO) Take 2 tablets by mouth daily.    Yes [provider]  Multiple Vitamin (MULTIVITAMIN) tablet Take 1 tablet by mouth daily.   Yes [provider]  valACYclovir (VALTREX) 1000 MG tablet Take 1 tablet (1,000 mg total) by mouth daily. For 5 days per occurrence. 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  Negative other than as indicated above or in HPI.    Objective:   Vitals:   10/02/22 1256 10/02/22 1304  BP: 134/82 130/82  Pulse: 76   Temp: 98.7 F (37.1 C)   SpO2: 96%   Weight: 223 lb 6 oz (101.3 kg)   Height: 5'  8.5" (1.74 m)    {Vitals History (Optional):23777}  Physical Exam Vitals reviewed.  Constitutional:      Appearance: He is well-developed.  HENT:     Head: Normocephalic and atraumatic.     Right Ear: External ear normal.     Left Ear: External ear normal.  Eyes:     Conjunctiva/sclera: Conjunctivae normal.     Pupils: Pupils are equal, round, and reactive to light.  Neck:     Thyroid: No thyromegaly.  Cardiovascular:     Rate and Rhythm: Normal rate and regular rhythm.     Heart sounds: Normal heart sounds.  Pulmonary:     Effort: Pulmonary effort is normal. No respiratory distress.     Breath sounds: Normal breath sounds. No wheezing.  Abdominal:     General: There is no distension.     Palpations: Abdomen is soft.     Tenderness: There is no abdominal tenderness.  Musculoskeletal:        General: No tenderness. Normal range of motion.     Cervical back: Normal range of motion and neck supple.  Lymphadenopathy:     Cervical: No cervical adenopathy.  Skin:    General: Skin is warm and dry.  Neurological:     Mental Status: He is alert and oriented to person, place, and time.     Deep Tendon Reflexes: Reflexes are normal and symmetric.  Psychiatric:        Behavior: Behavior normal.        Assessment & Plan:  DREON VACHA is a 67 y.o. male . Annual physical exam  Essential hypertension   No orders of the defined types were placed in this encounter.  Patient Instructions  If BP remains over 130/80 - can take 5mg  total amlodipine. Let me know if you make that change. See info on BP below.   Please send me recent labs and can decide if other testing needed.   I do recommend meeting with eye specialist including screening for retina changes with your blood pressure.   Take care!  Plantar Fasciitis  Plantar fasciitis is a painful foot condition that affects the heel. It occurs when the band of tissue that connects the toes to the heel bone (plantar  fascia) becomes irritated. This can happen as the result of exercising too much or doing other repetitive activities (overuse injury). Plantar fasciitis can cause mild irritation to severe pain that makes it difficult to walk or move. The pain is usually worse in the morning after sleeping, or after sitting or lying down for a period of time. Pain may also be worse after long periods of walking or standing. What are the causes? This condition may be caused by: Standing for long periods of time. Wearing shoes that do not have good arch support. Doing activities that put stress on joints (high-impact activities). This includes ballet and exercise that makes your heart beat faster (aerobic exercise), such as running. Being overweight. An abnormal way of walking (gait). Tight muscles in the back of your lower leg (calf). High arches in your feet or flat feet. Starting a new athletic activity. What are the signs or symptoms? The main symptom of this condition is heel pain. Pain may get worse after the following: Taking the first steps after a time of rest, especially in the morning after awakening, or after you have been sitting or lying down for a while. Long periods of standing still. Pain may decrease after 30-45 minutes of activity, such as gentle walking. How is this diagnosed? This condition may be diagnosed based on your medical history, a physical exam, and your symptoms. Your health care provider will check for: A tender area on the bottom of your foot. A high arch in your foot or flat feet. Pain when you move your foot. Difficulty moving your foot. You may have imaging tests to confirm the diagnosis, such as: X-rays. Ultrasound. MRI. How is  this treated? Treatment for plantar fasciitis depends on how severe your condition is. Treatment may include: Rest, ice, pressure (compression), and raising (elevating) the affected foot. This is called RICE therapy. Your health care provider may  recommend RICE therapy along with over-the-counter pain medicines to manage your pain. Exercises to stretch your calves and your plantar fascia. A splint that holds your foot in a stretched, upward position while you sleep (night splint). Physical therapy to relieve symptoms and prevent problems in the future. Injections of steroid medicine (cortisone) to relieve pain and inflammation. Stimulating your plantar fascia with electrical impulses (extracorporeal shock wave therapy). This is usually the last treatment option before surgery. Surgery, if other treatments have not worked after 12 months. Follow these instructions at home: Managing pain, stiffness, and swelling  If directed, put ice on the painful area. To do this: Put ice in a plastic bag, or use a frozen bottle of water. Place a towel between your skin and the bag or bottle. Roll the bottom of your foot over the bag or bottle. Do this for 20 minutes, 2-3 times a day. Wear athletic shoes that have air-sole or gel-sole cushions, or try soft shoe inserts that are designed for plantar fasciitis. Elevate your foot above the level of your heart while you are sitting or lying down. Activity Avoid activities that cause pain. Ask your health care provider what activities are safe for you. Do physical therapy exercises and stretches as told by your health care provider. Try activities and forms of exercise that are easier on your joints (low impact). Examples include swimming, water aerobics, and biking. General instructions Take over-the-counter and prescription medicines only as told by your health care provider. Wear a night splint while sleeping, if told by your health care provider. Loosen the splint if your toes tingle, become numb, or turn cold and blue. Maintain a healthy weight, or work with your health care provider to lose weight as needed. Keep all follow-up visits. This is important. Contact a health care provider if you  have: Symptoms that do not go away with home treatment. Pain that gets worse. Pain that affects your ability to move or do daily activities. Summary Plantar fasciitis is a painful foot condition that affects the heel. It occurs when the band of tissue that connects the toes to the heel bone (plantar fascia) becomes irritated. Heel pain is the main symptom of this condition. It may get worse after exercising too much or standing still for a long time. Treatment varies, but it usually starts with rest, ice, pressure (compression), and raising (elevating) the affected foot. This is called RICE therapy. Over-the-counter medicines can also be used to manage pain. This information is not intended to replace advice given to you by your health care provider. Make sure you discuss any questions you have with your health care provider. Document Revised: 05/16/2019 Document Reviewed: 05/16/2019 Elsevier Patient Education  2024 [employer].   Preventive Care 67-81 Years Old, Male Preventive care refers to lifestyle choices and visits with your health care provider that can promote health and wellness. Preventive care visits are also called wellness exams. What can I expect for my preventive care visit? Counseling During your preventive care visit, your health care provider may ask about your: Medical history, including: Past medical problems. Family medical history. Current health, including: Emotional well-being. Home life and relationship well-being. Sexual activity. Lifestyle, including: Alcohol, nicotine or tobacco, and drug use. Access to firearms. Diet, exercise, and sleep habits.  Safety issues such as seatbelt and bike helmet use. Sunscreen use. Work and work Astronomer. Physical exam Your health care provider will check your: Height and weight. These may be used to calculate your BMI (body mass index). BMI is a measurement that tells if you are at a healthy weight. Waist  circumference. This measures the distance around your waistline. This measurement also tells if you are at a healthy weight and may help predict your risk of certain diseases, such as type 2 diabetes and high blood pressure. Heart rate and blood pressure. Body temperature. Skin for abnormal spots. What immunizations do I need?  Vaccines are usually given at various ages, according to a schedule. Your health care provider will recommend vaccines for you based on your age, medical history, and lifestyle or other factors, such as travel or where you work. What tests do I need? Screening Your health care provider may recommend screening tests for certain conditions. This may include: Lipid and cholesterol levels. Diabetes screening. This is done by checking your blood sugar (glucose) after you have not eaten for a while (fasting). Hepatitis B test. Hepatitis C test. HIV (human immunodeficiency virus) test. STI (sexually transmitted infection) testing, if you are at risk. Lung cancer screening. Prostate cancer screening. Colorectal cancer screening. Talk with your health care provider about your test results, treatment options, and if necessary, the need for more tests. Follow these instructions at home: Eating and drinking  Eat a diet that includes fresh fruits and vegetables, whole grains, lean protein, and low-fat dairy products. Take vitamin and mineral supplements as recommended by your health care provider. Do not drink alcohol if your health care provider tells you not to drink. If you drink alcohol: Limit how much you have to 0-2 drinks a day. Know how much alcohol is in your drink. In the [place] one drink equals one 12 oz bottle of beer (355 mL), one 5 oz glass of wine (148 mL), or one 1 oz glass of hard liquor (44 mL). Lifestyle Brush your teeth every morning and night with fluoride toothpaste. Floss one time each day. Exercise for at least 30 minutes 5 or more days each week. Do  not use any products that contain nicotine or tobacco. These products include cigarettes, chewing tobacco, and vaping devices, such as e-cigarettes. If you need help quitting, ask your health care provider. Do not use drugs. If you are sexually active, practice safe sex. Use a condom or other form of protection to prevent STIs. Take aspirin only as told by your health care provider. Make sure that you understand how much to take and what form to take. Work with your health care provider to find out whether it is safe and beneficial for you to take aspirin daily. Find healthy ways to manage stress, such as: Meditation, yoga, or listening to music. Journaling. Talking to a trusted person. Spending time with friends and family. Minimize exposure to UV radiation to reduce your risk of skin cancer. Safety Always wear your seat belt while driving or riding in a vehicle. Do not drive: If you have been drinking alcohol. Do not ride with someone who has been drinking. When you are tired or distracted. While texting. If you have been using any mind-altering substances or drugs. Wear a helmet and other protective equipment during sports activities. If you have firearms in your house, make sure you follow all gun safety procedures. What's next? Go to your health care provider once a year for an annual  wellness visit. Ask your health care provider how often you should have your eyes and teeth checked. Stay up to date on all vaccines. This information is not intended to replace advice given to you by your health care provider. Make sure you discuss any questions you have with your health care provider. Document Revised: 07/25/2020 Document Reviewed: 07/25/2020 Elsevier Patient Education  2024 Else[employer] Inc[employer]   Managing Your Hypertension Hypertension, also called high blood pressure, is when the force of the blood pressing against the walls of the arteries is too strong. Arteries are blood vessels that  carry blood from your heart throughout your body. Hypertension forces the heart to work harder to pump blood and may cause the arteries to become narrow or stiff. Understanding blood pressure readings A blood pressure reading includes a higher number over a lower number: The first, or top, number is called the systolic pressure. It is a measure of the pressure in your arteries as your heart beats. The second, or bottom number, is called the diastolic pressure. It is a measure of the pressure in your arteries as the heart relaxes. For most people, a normal blood pressure is below 120/80. Your personal target blood pressure may vary depending on your medical conditions, your age, and other factors. Blood pressure is classified into four stages. Based on your blood pressure reading, your health care provider may use the following stages to determine what type of treatment you need, if any. Systolic pressure and diastolic pressure are measured in a unit called millimeters of mercury (mmHg). Normal Systolic pressure: below 120. Diastolic pressure: below 80. Elevated Systolic pressure: 120-129. Diastolic pressure: below 80. Hypertension stage 1 Systolic pressure: 130-139. Diastolic pressure: 80-89. Hypertension stage 2 Systolic pressure: 140 or above. Diastolic pressure: 90 or above. How can this condition affect me? Managing your hypertension is very important. Over time, hypertension can damage the arteries and decrease blood flow to parts of the body, including the brain, heart, and kidneys. Having untreated or uncontrolled hypertension can lead to: A heart attack. A stroke. A weakened blood vessel (aneurysm). Heart failure. Kidney damage. Eye damage. Memory and concentration problems. Vascular dementia. What actions can I take to manage this condition? Hypertension can be managed by making lifestyle changes and possibly by taking medicines. Your health care provider will help you make a  plan to bring your blood pressure within a normal range. You may be referred for counseling on a healthy diet and physical activity. Nutrition  Eat a diet that is high in fiber and potassium, and low in salt (sodium), added sugar, and fat. An example eating plan is called the DASH diet. DASH stands for Dietary Approaches to Stop Hypertension. To eat this way: Eat plenty of fresh fruits and vegetables. Try to fill one-half of your plate at each meal with fruits and vegetables. Eat whole grains, such as whole-wheat pasta, brown rice, or whole-grain bread. Fill about one-fourth of your plate with whole grains. Eat low-fat dairy products. Avoid fatty cuts of meat, processed or cured meats, and poultry with skin. Fill about one-fourth of your plate with lean proteins such as fish, chicken without skin, beans, eggs, and tofu. Avoid pre-made and processed foods. These tend to be higher in sodium, added sugar, and fat. Reduce your daily sodium intake. Many people with hypertension should eat less than 1,500 mg of sodium a day. Lifestyle  Work with your health care provider to maintain a healthy body weight or to lose weight. Ask what an  ideal weight is for you. Get at least 30 minutes of exercise that causes your heart to beat faster (aerobic exercise) most days of the week. Activities may include walking, swimming, or biking. Include exercise to strengthen your muscles (resistance exercise), such as weight lifting, as part of your weekly exercise routine. Try to do these types of exercises for 30 minutes at least 3 days a week. Do not use any products that contain nicotine or tobacco. These products include cigarettes, chewing tobacco, and vaping devices, such as e-cigarettes. If you need help quitting, ask your health care provider. Control any long-term (chronic) conditions you have, such as high cholesterol or diabetes. Identify your sources of stress and find ways to manage stress. This may include  meditation, deep breathing, or making time for fun activities. Alcohol use Do not drink alcohol if: Your health care provider tells you not to drink. You are pregnant, may be pregnant, or are planning to become pregnant. If you drink alcohol: Limit how much you have to: 0-1 drink a day for women. 0-2 drinks a day for men. Know how much alcohol is in your drink. In the [place] one drink equals one 12 oz bottle of beer (355 mL), one 5 oz glass of wine (148 mL), or one 1 oz glass of hard liquor (44 mL). Medicines Your health care provider may prescribe medicine if lifestyle changes are not enough to get your blood pressure under control and if: Your systolic blood pressure is 130 or higher. Your diastolic blood pressure is 80 or higher. Take medicines only as told by your health care provider. Follow the directions carefully. Blood pressure medicines must be taken as told by your health care provider. The medicine does not work as well when you skip doses. Skipping doses also puts you at risk for problems. Monitoring Before you monitor your blood pressure: Do not smoke, drink caffeinated beverages, or exercise within 30 minutes before taking a measurement. Use the bathroom and empty your bladder (urinate). Sit quietly for at least 5 minutes before taking measurements. Monitor your blood pressure at home as told by your health care provider. To do this: Sit with your back straight and supported. Place your feet flat on the floor. Do not cross your legs. Support your arm on a flat surface, such as a table. Make sure your upper arm is at heart level. Each time you measure, take two or three readings one minute apart and record the results. You may also need to have your blood pressure checked regularly by your health care provider. General information Talk with your health care provider about your diet, exercise habits, and other lifestyle factors that may be contributing to hypertension. Review  all the medicines you take with your health care provider because there may be side effects or interactions. Keep all follow-up visits. Your health care provider can help you create and adjust your plan for managing your high blood pressure. Where to find more information National Heart, Lung, and Blood Institute: [employer]is American Heart Association: www.heart.org Contact a health care provider if: You think you are having a reaction to medicines you have taken. You have repeated (recurrent) headaches. You feel dizzy. You have swelling in your ankles. You have trouble with your vision. Get help right away if: You develop a severe headache or confusion. You have unusual weakness or numbness, or you feel faint. You have severe pain in your chest or abdomen. You vomit repeatedly. You have trouble breathing. These symptoms may  be an emergency. Get help right away. Call 911. Do not wait to see if the symptoms will go away. Do not drive yourself to the hospital. Summary Hypertension is when the force of blood pumping through your arteries is too strong. If this condition is not controlled, it may put you at risk for serious complications. Your personal target blood pressure may vary depending on your medical conditions, your age, and other factors. For most people, a normal blood pressure is less than 120/80. Hypertension is managed by lifestyle changes, medicines, or both. Lifestyle changes to help manage hypertension include losing weight, eating a healthy, low-sodium diet, exercising more, stopping smoking, and limiting alcohol. This information is not intended to replace advice given to you by your health care provider. Make sure you discuss any questions you have with your health care provider. Document Revised: 10/11/2020 Document Reviewed: 10/11/2020 Elsevier Patient Education  2024 Elsevier Inc.       Signed,   Meredith Staggers, MD [facility], [facility] 10/02/22 1:39 PM

## 2022-10-03 ENCOUNTER — Telehealth: Payer: Self-pay | Admitting: Family Medicine

## 2022-10-03 NOTE — Telephone Encounter (Signed)
 Placed in folder at nurse station

## 2022-10-03 NOTE — Telephone Encounter (Signed)
Received forms from [facility] Printed & placed in provider bin

## 2022-10-15 ENCOUNTER — Ambulatory Visit (INDEPENDENT_AMBULATORY_CARE_PROVIDER_SITE_OTHER): Payer: Commercial Managed Care - PPO | Admitting: Podiatry

## 2022-10-15 ENCOUNTER — Encounter: Payer: Self-pay | Admitting: Podiatry

## 2022-10-15 ENCOUNTER — Ambulatory Visit (INDEPENDENT_AMBULATORY_CARE_PROVIDER_SITE_OTHER): Payer: Commercial Managed Care - PPO

## 2022-10-15 VITALS — BP 146/104

## 2022-10-15 DIAGNOSIS — M722 Plantar fascial fibromatosis: Secondary | ICD-10-CM | POA: Diagnosis not present

## 2022-10-15 MED ORDER — MELOXICAM 15 MG PO TABS: 15.0000 mg | ORAL_TABLET | Freq: Every day | ORAL | 0 refills | Status: DC

## 2022-10-15 MED ORDER — DEXAMETHASONE SODIUM PHOSPHATE 120 MG/30ML IJ SOLN
4.0000 mg | Freq: Once | INTRAMUSCULAR | Status: AC
Start: 2022-10-15 — End: 2022-10-15
  Administered 2022-10-15: 4 mg via INTRA_ARTICULAR

## 2022-10-15 MED ORDER — TRIAMCINOLONE ACETONIDE 10 MG/ML IJ SUSP
2.5000 mg | Freq: Once | INTRAMUSCULAR | Status: AC
Start: 2022-10-15 — End: 2022-10-15
  Administered 2022-10-15: 2.5 mg via INTRA_ARTICULAR

## 2022-10-15 NOTE — Progress Notes (Signed)
  Subjective:  Patient ID: Charles [name], male    DOB: 01-24-1956,   MRN: [id number]  Chief Complaint  Patient presents with   Plantar Fasciitis    Left foot heel pain    67 y.o. male presents for concern of left heel pain that has been going on for years. Relates for 20+ years has dealt with plantar fasciitis off and on. Relates he has had custom orthotics and injections in the past and pain will come an go. Recently been worsening and most recent orthtoics are 67 years old. .  . Denies any other pedal complaints. Denies n/v/f/c.   Past Medical History:  Diagnosis Date   Arthritis    fingers,left knee   Hemorrhoids, internal, with bleeding 06/23/2018   HIV infection (HCC)    Phreesia 08/02/2019   HIV positive (HCC)    Personal history of colonic adenoma 02/24/2013   Plantar fasciitis     Objective:  Physical Exam: Vascular: DP/PT pulses 2/4 bilateral. CFT <3 seconds. Normal hair growth on digits. No edema.  Skin. No lacerations or abrasions bilateral feet.  Musculoskeletal: MMT 5/5 bilateral lower extremities in DF, PF, Inversion and Eversion. Deceased ROM in DF of ankle joint. Tender to medial calcaneal tubercle on the left. No pain with calcaneal squeeze. No pain along PT achilles or arch.   Neurological: Sensation intact to light touch.   Assessment:   1. Plantar fasciitis of left foot      Plan:  Patient was evaluated and treated and all questions answered. Discussed plantar fasciitis with patient.  X-rays reviewed and discussed with patient. No acute fractures or dislocations noted. Mild spurring noted at inferior calcaneus.  Discussed treatment options including, ice, NSAIDS, supportive shoes, bracing, and stretching. Stretching exercises provided to be done on a daily basis.   Prescription for meloxicam provided and sent to pharmacy. Patient requesting injection today. Procedure note below.  BMP reviewed and no concerns with kidneys.  PF brace dispensed.  Will  plan for orthotics next visit.  Follow-up 6 weeks or sooner if any problems arise. In the meantime, encouraged to call the office with any questions, concerns, change in symptoms.   Procedure:  Discussed etiology, pathology, conservative vs. surgical therapies. At this time a plantar fascial injection was recommended.  The patient agreed and a sterile skin prep was applied.  An injection consisting of  1cc dexamethasone 0.5 cc kenalog and 1cc marcaine mixture was infiltrated at the point of maximal tenderness on the left Heel.  Bandaid applied. The patient tolerated this well and was given instructions for aftercare.    Louann Sjogren, DPM

## 2022-10-15 NOTE — Patient Instructions (Addendum)

## 2022-11-26 ENCOUNTER — Ambulatory Visit (INDEPENDENT_AMBULATORY_CARE_PROVIDER_SITE_OTHER): Payer: Commercial Managed Care - PPO | Admitting: Podiatry

## 2022-11-26 ENCOUNTER — Encounter: Payer: Self-pay | Admitting: Podiatry

## 2022-11-26 DIAGNOSIS — M722 Plantar fascial fibromatosis: Secondary | ICD-10-CM

## 2022-11-26 MED ORDER — TRIAMCINOLONE ACETONIDE 10 MG/ML IJ SUSP
2.5000 mg | Freq: Once | INTRAMUSCULAR | Status: AC
Start: 2022-11-26 — End: 2022-11-26
  Administered 2022-11-26: 2.5 mg via INTRA_ARTICULAR

## 2022-11-26 MED ORDER — MELOXICAM 15 MG PO TABS: 15.0000 mg | ORAL_TABLET | Freq: Every day | ORAL | 0 refills | Status: DC

## 2022-11-26 MED ORDER — DEXAMETHASONE SODIUM PHOSPHATE 120 MG/30ML IJ SOLN
4.0000 mg | Freq: Once | INTRAMUSCULAR | Status: AC
Start: 2022-11-26 — End: 2022-11-26
  Administered 2022-11-26: 4 mg via INTRA_ARTICULAR

## 2022-11-26 NOTE — Addendum Note (Signed)
Addended by: Louann Sjogren R on: 11/26/2022 10:15 AM   Modules accepted: Orders

## 2022-11-26 NOTE — Progress Notes (Signed)
  Subjective:  Patient ID: Charles [name], male    DOB: November 25, 1955,   MRN: [id number]  No chief complaint on file.   67 y.o. male presents for follow-up of left plantar fasciitis. Relates doing a little better but has had good and bad days. Relates the injection helped a few days. The brace helped for a bit but went back to other support. Relates some stretching and meloxicam helps when he needs.   . Denies any other pedal complaints. Denies n/v/f/c.   Past Medical History:  Diagnosis Date   Arthritis    fingers,left knee   Hemorrhoids, internal, with bleeding 06/23/2018   HIV infection (HCC)    Phreesia 08/02/2019   HIV positive (HCC)    Personal history of colonic adenoma 02/24/2013   Plantar fasciitis     Objective:  Physical Exam: Vascular: DP/PT pulses 2/4 bilateral. CFT <3 seconds. Normal hair growth on digits. No edema.  Skin. No lacerations or abrasions bilateral feet.  Musculoskeletal: MMT 5/5 bilateral lower extremities in DF, PF, Inversion and Eversion. Deceased ROM in DF of ankle joint. Tender to medial calcaneal tubercle on the left. No pain with calcaneal squeeze. No pain along PT achilles or arch.   Neurological: Sensation intact to light touch.   Assessment:   1. Plantar fasciitis of left foot      Plan:  Patient was evaluated and treated and all questions answered. Discussed plantar fasciitis with patient.  X-rays reviewed and discussed with patient. No acute fractures or dislocations noted. Mild spurring noted at inferior calcaneus.  Discussed treatment options including, ice, NSAIDS, supportive shoes, bracing, and stretching. Stretching exercises provided to be done on a daily basis.   Prescription for meloxicam provided and sent to pharmacy. Patient requesting injection today. Procedure note below.  BMP reviewed and no concerns with kidneys.  Continue brace. Will get fitted for orthotics.  Amb ref to PT  Follow-up 6 weeks or sooner if any problems  arise. In the meantime, encouraged to call the office with any questions, concerns, change in symptoms.   Procedure:  Discussed etiology, pathology, conservative vs. surgical therapies. At this time a plantar fascial injection was recommended.  The patient agreed and a sterile skin prep was applied.  An injection consisting of  1cc dexamethasone 0.5 cc kenalog and 1cc marcaine mixture was infiltrated at the point of maximal tenderness on the left Heel.  Bandaid applied. The patient tolerated this well and was given instructions for aftercare.    Louann Sjogren, DPM

## 2022-12-12 NOTE — Therapy (Signed)
OUTPATIENT PHYSICAL THERAPY LOWER EXTREMITY EVALUATION   Patient Name: Charles [name] MRN: [id number] DOB:[birth year], 67 y.o., male Today's Date: 12/15/2022  END OF SESSION:  PT End of Session - 12/15/22 0914     Visit Number 1    Number of Visits 5    Date for PT Re-Evaluation 01/15/23    Authorization Type Medicare AB    Progress Note Due on Visit 10    PT Start Time 0915    PT Stop Time 0957    PT Time Calculation (min) 42 min    Activity Tolerance Patient tolerated treatment well    Behavior During Therapy [facility] for tasks assessed/performed             Past Medical History:  Diagnosis Date   Arthritis    fingers,left knee   Hemorrhoids, internal, with bleeding 06/23/2018   HIV infection (HCC)    Phreesia 08/02/2019   HIV positive (HCC)    Personal history of colonic adenoma 02/24/2013   Plantar fasciitis    Past Surgical History:  Procedure Laterality Date   COLONOSCOPY     HEMORRHOID BANDING     PROSTATE BIOPSY     TONSILLECTOMY     at age 35   Patient Active Problem List   Diagnosis Date Noted   History of colonic polyps 02/24/2013   HIV (human immunodeficiency virus infection) (HCC) 05/01/2011    PCP: Charles Flood, MD  REFERRING PROVIDER: Louann [name], DPM  REFERRING DIAG: M72.2 (ICD-10-CM) - Plantar fasciitis of left foot  THERAPY DIAG:  Pain in left foot  Difficulty in walking, not elsewhere classified  Rationale for Evaluation and Treatment: Rehabilitation  ONSET DATE: >30 years ago, exacerbation this year  SUBJECTIVE:   SUBJECTIVE STATEMENT: Pt states he has had plantar fasciitis for 34 years after an episode while he was walking barefoot on the beach. Was managing well with orthotics and injections, tends to have flareups every 10-15 years. Pt states that he retired a few years ago, and during this time has been walking barefoot around his house which seems to have flared things up over the past year or so. States he has had  two injections since September, the latter of which (mid October) seems to have provided some better relief particularly over the past few days, but he also notes he has been using insert more and made a minor change to the heel portion. He states he is getting fitted for a new orthotic this week. When barefoot can usually walk about or less before pain hits, but this is variable. Walks daily.  No N/T, no swelling, no referred pain.   PERTINENT HISTORY: HIV  PAIN:  Are you having pain: 0/10 Location/description: L arch Best-worst over past week: 0-1.5/10  - aggravating factors: WB without bracing, pressure - Easing factors: injections, bracing, ice, stretching    PRECAUTIONS: HIV  WEIGHT BEARING RESTRICTIONS: No  FALLS:  Has patient fallen in last 6 months? 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  DIAGNOSTIC FINDINGS:  L foot XR 10/15/22, refer to podiatry notes  PATIENT SURVEYS:  FOTO 84 current, 81 predicted  COGNITION: Overall cognitive status: Within functional limits for tasks assessed  SENSATION: Denies sensory complaints  EDEMA:  No apparent edema on exam today  PALPATION: No TTP on exam today  LOWER EXTREMITY ROM:  Active ROM Right eval Left eval  Knee flexion    Knee extension    Ankle dorsiflexion 10 deg 4 deg  Ankle plantarflexion 50 deg 52 deg  Ankle inversion 20 deg 22 deg  Ankle eversion 15 deg 10 deg   (Blank rows = not tested) Comments:  mild subjective stiffness of L hallux passively, nonpainful  LOWER EXTREMITY MMT:    MMT Right eval Left eval  Knee flexion    Knee extension    Ankle dorsiflexion 5 5   Ankle plantarflexion    Ankle inversion 5 5  Ankle eversion 5 5   (Blank rows = not tested) (Key: WFL = within functional limits not formally assessed, * = concordant pain, s = stiffness/stretching sensation, NT = not tested)  Comments:    GAIT: Distance walked: within clinic Assistive device utilized: None Level of assistance: Complete Independence Comments: gait mechanics grossly WNL   TODAY'S TREATMENT:                                                                                                                              [facility] Adult PT Treatment:                                                DATE: 12/15/22 Therapeutic Exercise: Heel raises, gastroc stretch practice reps, cues for appropriate performance  HEP handout + education on appropriate performance, rationale for interventions, relevant anatomy/physiology     PATIENT EDUCATION:  Education details: Pt education on PT impairments, prognosis, and POC. Informed consent. Rationale for interventions, safe/appropriate HEP performance Person educated: Patient Education method: Explanation, Demonstration, Tactile cues, Verbal cues Education comprehension: verbalized understanding, returned demonstration, verbal cues required, tactile cues required, and needs further education    HOME EXERCISE PROGRAM: Access Code: [email or web address] URL: [email or web address] Date: 12/15/2022 Prepared by: Charles [name]  Exercises - Heel Raises with Counter Support  - 2-3 x daily - 1 sets - 12-15 reps - Gastroc Stretch on Wall  - 2-3 x daily - 1 sets - 1-2 reps - 30sec hold  ASSESSMENT:  CLINICAL IMPRESSION: Patient is a pleasant 67 y.o. gentleman who was seen today for physical therapy evaluation and treatment for L plantar fascia issues ongoing over past 34 years. He notes flareups tend to occur every 10-15 years and this episode was preceded by increased time walking barefoot, has improved recently with injections and increased  brace use. On exam he does demonstrate reduced mobility of symptomatic ankle although symptoms are not irritable today. Also has some subjective stiffness of L hallux although nonpainful. Does well with HEP, requires minimal cueing to reduce compensations. No adverse events. Recommend skilled PT to address  ankle mobility and improve endurance of calf, foot intrinsic/extrinsic musculature to address pt goal of being able to improve walking tolerance around home without external support. Pt departs today's session in no acute distress, all voiced questions/concerns addressed appropriately from PT perspective.    OBJECTIVE IMPAIRMENTS: decreased activity tolerance, decreased endurance, decreased mobility, difficulty walking, decreased ROM, and pain.   ACTIVITY LIMITATIONS: standing, transfers, and locomotion level  PARTICIPATION LIMITATIONS:  denies overt limitations  PERSONAL FACTORS: Time since onset of injury/illness/exacerbation are also affecting patient's functional outcome.   REHAB POTENTIAL: Good  CLINICAL DECISION MAKING: Stable/uncomplicated  EVALUATION COMPLEXITY: Low   GOALS: Goals reviewed with patient? No - did discuss role of PT/POC  SHORT TERM GOALS: Target date: 01/15/2023 1.  Pt will demonstrate grossly symmetrical ankle dorsiflexion AROM in order to facilitate improved tolerance to functional movements and gait. Baseline: see ROM chart above Goal status: INITIAL  2.  Pt will demonstrate appropriate performance of final prescribed HEP in order to facilitate improved self-management of symptoms post-discharge.   Baseline: initial HEP prescribed  Goal status: INITIAL    3. Pt will report improved walking tolerance of at least half an hour without external support in order to facilitate improved tolerance to home navigation and activities within home.  Baseline: ~36min avg tolerance without external support per pt   Goal status: INITIAL   PLAN:  PT FREQUENCY:  1x/week  PT DURATION: 4 weeks  PLANNED INTERVENTIONS: 97164- PT Re-evaluation, 97110-Therapeutic exercises, 97530- Therapeutic activity, 97112- Neuromuscular re-education, 97535- Self Care, 16109- Manual therapy, [phone]- Gait training, Patient/Family education, Balance training, Stair training, Taping, Dry Needling, Joint mobilization, Cryotherapy, and Moist heat  PLAN FOR NEXT SESSION: Review/update HEP PRN. Work on [employer] exercises as appropriate with emphasis on calf endurance, foot intrinsic/extrinsic training with WB progressions as able. Symptom modification strategies as indicated/appropriate.    Ashley Murrain PT, DPT 12/15/2022 11:58 AM

## 2022-12-15 ENCOUNTER — Encounter: Payer: Self-pay | Admitting: Physical Therapy

## 2022-12-15 ENCOUNTER — Ambulatory Visit: Payer: Medicare Other | Attending: Podiatry | Admitting: Physical Therapy

## 2022-12-15 ENCOUNTER — Other Ambulatory Visit: Payer: Self-pay

## 2022-12-15 DIAGNOSIS — M722 Plantar fascial fibromatosis: Secondary | ICD-10-CM | POA: Diagnosis not present

## 2022-12-15 DIAGNOSIS — M79672 Pain in left foot: Secondary | ICD-10-CM | POA: Insufficient documentation

## 2022-12-15 DIAGNOSIS — R262 Difficulty in walking, not elsewhere classified: Secondary | ICD-10-CM | POA: Insufficient documentation

## 2022-12-17 ENCOUNTER — Ambulatory Visit (INDEPENDENT_AMBULATORY_CARE_PROVIDER_SITE_OTHER): Payer: Medicare Other

## 2022-12-17 DIAGNOSIS — M722 Plantar fascial fibromatosis: Secondary | ICD-10-CM | POA: Diagnosis not present

## 2022-12-17 NOTE — Progress Notes (Signed)
 Orthotic eval   Patient was seen, measured / scanned for custom molded foot orthotics.  Patient will benefit from CFO's as they will help provide total contact to MLA's helping to better distribute body weight across BIL feet greater reducing plantar pressure and pain and to also encourage FF and RF alignment.  Patient was scanned items to be ordered and fit when in

## 2022-12-22 ENCOUNTER — Emergency Department (HOSPITAL_COMMUNITY): Payer: Medicare Other

## 2022-12-22 ENCOUNTER — Emergency Department (HOSPITAL_COMMUNITY)
Admission: EM | Admit: 2022-12-22 | Discharge: 2022-12-23 | Disposition: A | Discharge status: Home / Self Care | Payer: Medicare Other | Attending: Emergency Medicine | Admitting: Emergency Medicine

## 2022-12-22 DIAGNOSIS — Z21 Asymptomatic human immunodeficiency virus [HIV] infection status: Secondary | ICD-10-CM | POA: Insufficient documentation

## 2022-12-22 DIAGNOSIS — J40 Bronchitis, not specified as acute or chronic: Secondary | ICD-10-CM | POA: Diagnosis not present

## 2022-12-22 DIAGNOSIS — R079 Chest pain, unspecified: Secondary | ICD-10-CM | POA: Diagnosis present

## 2022-12-22 DIAGNOSIS — R42 Dizziness and giddiness: Secondary | ICD-10-CM | POA: Insufficient documentation

## 2022-12-22 DIAGNOSIS — R55 Syncope and collapse: Secondary | ICD-10-CM | POA: Insufficient documentation

## 2022-12-22 DIAGNOSIS — R1012 Left upper quadrant pain: Secondary | ICD-10-CM | POA: Diagnosis not present

## 2022-12-22 DIAGNOSIS — M47814 Spondylosis without myelopathy or radiculopathy, thoracic region: Secondary | ICD-10-CM | POA: Insufficient documentation

## 2022-12-22 DIAGNOSIS — Z1152 Encounter for screening for COVID-19: Secondary | ICD-10-CM | POA: Diagnosis not present

## 2022-12-22 DIAGNOSIS — R109 Unspecified abdominal pain: Secondary | ICD-10-CM | POA: Insufficient documentation

## 2022-12-22 LAB — RESP PANEL BY RT-PCR (RSV, FLU A&B, COVID)  RVPGX2
Influenza A by PCR: NEGATIVE
Influenza B by PCR: NEGATIVE
Resp Syncytial Virus by PCR: NEGATIVE
SARS Coronavirus 2 by RT PCR: NEGATIVE

## 2022-12-22 LAB — CBC WITH DIFFERENTIAL/PLATELET
Abs Immature Granulocytes: 0.01 10*3/uL (ref 0.00–0.07)
Basophils Absolute: 0.1 10*3/uL (ref 0.0–0.1)
Basophils Relative: 1 %
Eosinophils Absolute: 0.6 10*3/uL — ABNORMAL HIGH (ref 0.0–0.5)
Eosinophils Relative: 8 %
HCT: 44.4 % (ref 39.0–52.0)
Hemoglobin: 15.2 g/dL (ref 13.0–17.0)
Immature Granulocytes: 0 %
Lymphocytes Relative: 13 %
Lymphs Abs: 0.9 10*3/uL (ref 0.7–4.0)
MCH: 33.3 pg (ref 26.0–34.0)
MCHC: 34.2 g/dL (ref 30.0–36.0)
MCV: 97.4 fL (ref 80.0–100.0)
Monocytes Absolute: 0.5 10*3/uL (ref 0.1–1.0)
Monocytes Relative: 8 %
Neutro Abs: 4.9 10*3/uL (ref 1.7–7.7)
Neutrophils Relative %: 70 %
Platelets: 173 10*3/uL (ref 150–400)
RBC: 4.56 MIL/uL (ref 4.22–5.81)
RDW: 12.2 % (ref 11.5–15.5)
WBC: 7 10*3/uL (ref 4.0–10.5)
nRBC: 0 % (ref 0.0–0.2)

## 2022-12-22 LAB — BASIC METABOLIC PANEL WITH GFR
Anion gap: 9 (ref 5–15)
BUN: 12 mg/dL (ref 8–23)
CO2: 23 mmol/L (ref 22–32)
Calcium: 8.8 mg/dL — ABNORMAL LOW (ref 8.9–10.3)
Chloride: 106 mmol/L (ref 98–111)
Creatinine, Ser: 0.92 mg/dL (ref 0.61–1.24)
GFR, Estimated: >60 mL/min
Glucose, Bld: 110 mg/dL — ABNORMAL HIGH (ref 70–99)
Potassium: 3.7 mmol/L (ref 3.5–5.1)
Sodium: 138 mmol/L (ref 135–145)

## 2022-12-22 LAB — TROPONIN I (HIGH SENSITIVITY): Troponin I (High Sensitivity): 4 ng/L

## 2022-12-22 MED ORDER — LACTATED RINGERS IV BOLUS
1000.0000 mL | Freq: Once | INTRAVENOUS | Status: AC
  Administered 2022-12-22: 1000 mL via INTRAVENOUS

## 2022-12-22 MED ORDER — IOHEXOL 350 MG/ML SOLN
100.0000 mL | Freq: Once | INTRAVENOUS | Status: AC | PRN
  Administered 2022-12-22: 100 mL via INTRAVENOUS

## 2022-12-22 NOTE — ED Provider Notes (Signed)
[place] EMERGENCY DEPARTMENT AT [facility] Provider Note   CSN: [id number] Arrival date & time: 12/22/22  2133     History {Add pertinent medical, surgical, social history, OB history to HPI:1} Chief Complaint  Patient presents with   Chest Pain    Charles [name] is a 67 y.o. male.   Chest Pain Associated symptoms: abdominal pain and cough   Patient presents for syncope.  Medical history includes HIV, arthritis.  He takes his antivirals and has had well-controlled HIV.  He attended a conference in [place] last week.  He returned home by car on Sunday.  Sunday he developed a stuffy nose and feared he got a cold.  He has had a mild cough which does seem to be improving.  For the past 2 days, he has had a left upper quadrant abdominal pain.  This pain is mainly noticeable with coughing and movements.  He has no pain currently at rest.  This evening at 9 PM, he was sitting in recliner.  When he went to stand up, he had a syncopal episode.  He denies any prodromal symptoms.  He denies any history of syncope.  He does not see a cardiologist.      Home Medications Prior to Admission medications   Medication Sig Start Date End Date Taking? Authorizing Provider  abacavir-dolutegravir-lamiVUDine (TRIUMEQ) 600-50-300 MG tablet Take 1 tablet by mouth daily.    [provider]  amLODipine (NORVASC) 2.5 MG tablet Take 1 tablet (2.5 mg total) by mouth daily. 08/21/22   Laurence Spates, MD  Ascorbic Acid (VITAMIN C) 1000 MG tablet Take 1,000 mg by mouth daily.    [provider]  Boswellia-Glucosamine-Vit D (OSTEO BI-FLEX ONE PER DAY PO) Take 2 tablets by mouth daily.     [provider]  meloxicam (MOBIC) 15 MG tablet Take 1 tablet (15 mg total) by mouth daily. 11/26/22   Louann Sjogren, DPM  Multiple Vitamin (MULTIVITAMIN) tablet Take 1 tablet by mouth daily.    [provider]  valACYclovir (VALTREX) 1000 MG tablet Take 1 tablet (1,000 mg  total) by mouth daily. For 5 days per occurrence. 11/03/16   Shade Flood, MD      Allergies    Efavirenz and Emtricitabine-tenofovir df    Review of Systems   Review of Systems  HENT:  Positive for congestion and rhinorrhea.   Respiratory:  Positive for cough.   Gastrointestinal:  Positive for abdominal pain.  Neurological:  Positive for syncope.  All other systems reviewed and are negative.   Physical Exam Updated Vital Signs BP (!) 160/104 (BP Location: Left Arm)   Pulse 79   Temp 99 F (37.2 C) (Oral)   Resp 16   SpO2 94%  Physical Exam Vitals and nursing note reviewed.  Constitutional:      General: He is not in acute distress.    Appearance: He is well-developed. He is not ill-appearing, toxic-appearing or diaphoretic.  HENT:     Head: Normocephalic and atraumatic.  Eyes:     Conjunctiva/sclera: Conjunctivae normal.  Cardiovascular:     Rate and Rhythm: Normal rate and regular rhythm.  Pulmonary:     Effort: Pulmonary effort is normal.     Breath sounds: Normal breath sounds. No wheezing, rhonchi or rales.  Chest:     Chest wall: No tenderness.  Abdominal:     Palpations: Abdomen is soft.     Tenderness: There is no abdominal tenderness.  Musculoskeletal:  General: No swelling.     Cervical back: Normal range of motion and neck supple.     Right lower leg: No edema.     Left lower leg: No edema.  Skin:    General: Skin is warm and dry.     Capillary Refill: Capillary refill takes less than 2 seconds.     Coloration: Skin is not cyanotic or pale.  Neurological:     General: No focal deficit present.     Mental Status: He is alert and oriented to person, place, and time.  Psychiatric:        Mood and Affect: Mood normal.        Behavior: Behavior normal.     ED Results / Procedures / Treatments   Labs (all labs ordered are listed, but only abnormal results are displayed) Labs Reviewed  RESP PANEL BY RT-PCR (RSV, FLU A&B, COVID)  [id number]   CBC WITH DIFFERENTIAL/PLATELET  BASIC METABOLIC PANEL  TROPONIN I (HIGH SENSITIVITY)    EKG None  Radiology No results found.  Procedures Procedures  {Document cardiac monitor, telemetry assessment procedure when appropriate:1}  Medications Ordered in ED Medications - No data to display  ED Course/ Medical Decision Making/ A&P   {   Click here for ABCD2, HEART and other calculatorsREFRESH Note before signing :1}                              Medical Decision Making  This patient presents to the ED for concern of ***, this involves an extensive number of treatment options, and is a complaint that carries with it a high risk of complications and morbidity.  The differential diagnosis includes ***   Co morbidities that complicate the patient evaluation  ***   Additional history obtained:  Additional history obtained from *** External records from outside source obtained and reviewed including ***   Lab Tests:  I Ordered, and personally interpreted labs.  The pertinent results include:  ***   Imaging Studies ordered:  I ordered imaging studies including ***  I independently visualized and interpreted imaging which showed *** I agree with the radiologist interpretation   Cardiac Monitoring: / EKG:  The patient was maintained on a cardiac monitor.  I personally viewed and interpreted the cardiac monitored which showed an underlying rhythm of: ***   Consultations Obtained:  I requested consultation with the ***,  and discussed lab and imaging findings as well as pertinent plan - they recommend: ***   Problem List / ED Course / Critical interventions / Medication management  Patient presents after syncopal episode.  On arrival, vital signs are notable for moderate hypertension.  Patient is well-appearing on exam.  He has no focal neurologic deficits.  No cardiac murmurs or rubs are appreciated.  Breathing is unlabored.  Currently, at rest, he is asymptomatic.   He has been having a left upper quadrant pain for the past 2 days.  He has had recent URI symptoms.  Patient to be placed on bedside cardiac monitor.  Workup was initiated.***. I ordered medication including ***  for ***  Reevaluation of the patient after these medicines showed that the patient {resolved/improved/worsened:23923::"improved"} I have reviewed the patients home medicines and have made adjustments as needed   Social Determinants of Health:  ***   Test / Admission - Considered:  ***   {Document critical care time when appropriate:1} {Document review of labs and clinical decision tools  ie heart score, Chads2Vasc2 etc:1}  {Document your independent review of radiology images, and any outside records:1} {Document your discussion with family members, caretakers, and with consultants:1} {Document social determinants of health affecting pt's care:1} {Document your decision making why or why not admission, treatments were needed:1} Final Clinical Impression(s) / ED Diagnoses Final diagnoses:  None    Rx / DC Orders ED Discharge Orders     None

## 2022-12-22 NOTE — ED Provider Triage Note (Signed)
Emergency Medicine Provider Triage Evaluation Note  Charles [name] , [name] 67 y.o. male  was evaluated in triage.  Pt complains of CP since Sunday. Was in [place] then. Runny nose, cough, sneezing. CP to left chest, worse with cough, movement. Syncope after getting up from [name] at 9 PM. Denies hitting head. No preceding cymptoms  Review of Systems  Positive: CP, cough, syncope Negative: fever  Physical Exam  BP (!) 160/104 (BP Location: Left Arm)   Pulse 79   Temp 99 F (37.2 C) (Oral)   Resp 16   SpO2 94%  Gen:   Awake, no distress   Resp:  Normal effort  MSK:   Moves extremities without difficulty  Other:    Medical Decision Making  Medically screening exam initiated at 10:10 PM.  Appropriate orders placed.  Charles [name] was informed that the remainder of the evaluation will be completed by another provider, this initial triage assessment does not replace that evaluation, and the importance of remaining in the ED until their evaluation is complete.  CP, URI sx, Syncope   Charles Rainey A, PA-C 12/22/22 2213

## 2022-12-22 NOTE — ED Triage Notes (Signed)
Patient complains of chest pain to left side of chest, passed out prior to coming to ER. States he is nauseated but no nausea. Rates pain 5/10.

## 2022-12-22 NOTE — ED Notes (Signed)
Pt. Attached to the cardiac monitor

## 2022-12-23 DIAGNOSIS — J40 Bronchitis, not specified as acute or chronic: Secondary | ICD-10-CM | POA: Diagnosis not present

## 2022-12-23 LAB — BRAIN NATRIURETIC PEPTIDE: B Natriuretic Peptide: 52.3 pg/mL (ref 0.0–100.0)

## 2022-12-23 LAB — MAGNESIUM: Magnesium: 2.1 mg/dL (ref 1.7–2.4)

## 2022-12-23 LAB — HEPATIC FUNCTION PANEL
ALT: 24 U/L (ref 0–44)
AST: 24 U/L (ref 15–41)
Albumin: 4.2 g/dL (ref 3.5–5.0)
Alkaline Phosphatase: 70 U/L (ref 38–126)
Bilirubin, Direct: 0.2 mg/dL (ref 0.0–0.2)
Indirect Bilirubin: 1.1 mg/dL — ABNORMAL HIGH (ref 0.3–0.9)
Total Bilirubin: 1.3 mg/dL — ABNORMAL HIGH (ref <1.2)
Total Protein: 7.4 g/dL (ref 6.5–8.1)

## 2022-12-23 LAB — LIPASE, BLOOD: Lipase: 32 U/L (ref 11–51)

## 2022-12-23 LAB — TROPONIN I (HIGH SENSITIVITY): Troponin I (High Sensitivity): 5 ng/L (ref <18)

## 2022-12-23 MED ORDER — PREDNISONE 10 MG PO TABS: 40.0000 mg | ORAL_TABLET | Freq: Every day | ORAL | 0 refills | Status: AC

## 2022-12-23 NOTE — Discharge Instructions (Signed)
Test results today are reassuring.  A prescription for prednisone was sent to your pharmacy for treatment of bronchitis.  Take over-the-counter cough suppressants as needed.  Return to the emergency department for any new or worsening symptoms of concern.

## 2022-12-25 ENCOUNTER — Telehealth: Payer: Self-pay

## 2022-12-25 ENCOUNTER — Encounter: Payer: Self-pay | Admitting: Physical Therapy

## 2022-12-25 ENCOUNTER — Ambulatory Visit: Payer: Medicare Other | Admitting: Physical Therapy

## 2022-12-25 DIAGNOSIS — R262 Difficulty in walking, not elsewhere classified: Secondary | ICD-10-CM

## 2022-12-25 DIAGNOSIS — M79672 Pain in left foot: Secondary | ICD-10-CM

## 2022-12-25 NOTE — Transitions of Care (Post Inpatient/ED Visit) (Signed)
   12/25/2022  Name: Charles [name] MRN: [id number] DOB: 07-15-1955  Today's TOC FU Call Status: Today's TOC FU Call Status:: Successful TOC FU Call Completed TOC FU Call Complete Date: 12/25/22 Patient's Name and Date of Birth confirmed.  Transition Care Management Follow-up Telephone Call Date of Discharge: 12/23/22 Discharge Facility: [facility]) Type of Discharge: Emergency Department Reason for ED Visit: Respiratory Respiratory Diagnosis:  (Bronchitis) How have you been since you were released from the hospital?: Better Any questions or concerns?: No  Items Reviewed: Did you receive and understand the discharge instructions provided?: Yes Medications obtained,verified, and reconciled?: Yes (Medications Reviewed) Any new allergies since your discharge?: No Dietary orders reviewed?: NA Do you have support at home?: No  Medications Reviewed Today: Medications Reviewed Today   Medications were not reviewed in this encounter     Home Care and Equipment/Supplies: Were Home Health Services Ordered?: NA Any new equipment or medical supplies ordered?: NA  Functional Questionnaire: Do you need assistance with bathing/showering or dressing?: No Do you need assistance with meal preparation?: No Do you need assistance with eating?: No Do you have difficulty maintaining continence: No Do you need assistance with getting out of bed/getting out of a chair/moving?: No Do you have difficulty managing or taking your medications?: No  Follow up appointments reviewed: PCP Follow-up appointment confirmed?: Yes Date of PCP follow-up appointment?: 01/01/23 Follow-up Provider: Meredith Staggers Specialist [facility] Follow-up appointment confirmed?: NA Do you need transportation to your follow-up appointment?: No Do you understand care options if your condition(s) worsen?: Yes-patient verbalized understanding    SIGNATURE [facility]

## 2022-12-25 NOTE — Therapy (Signed)
OUTPATIENT PHYSICAL THERAPY LOWER EXTREMITY TREATMENT   Patient Name: Charles [name] MRN: [id number] DOB:02-Feb-1956, 67 y.o., male Today's Date: 12/25/2022  END OF SESSION:  PT End of Session - 12/25/22 1020     Visit Number 2    Number of Visits 5    Date for PT Re-Evaluation 01/15/23    Authorization Type Medicare AB    Progress Note Due on Visit 10    PT Start Time 1015    PT Stop Time 1100    PT Time Calculation (min) 45 min    Activity Tolerance Patient tolerated treatment well    Behavior During Therapy [facility] for tasks assessed/performed              Past Medical History:  Diagnosis Date   Arthritis    fingers,left knee   Hemorrhoids, internal, with bleeding 06/23/2018   HIV infection (HCC)    Phreesia 08/02/2019   HIV positive (HCC)    Personal history of colonic adenoma 02/24/2013   Plantar fasciitis    Past Surgical History:  Procedure Laterality Date   COLONOSCOPY     HEMORRHOID BANDING     PROSTATE BIOPSY     TONSILLECTOMY     at age 36   Patient Active Problem List   Diagnosis Date Noted   History of colonic polyps 02/24/2013   HIV (human immunodeficiency virus infection) (HCC) 05/01/2011    PCP: Shade Flood, MD  REFERRING PROVIDER: Louann Sjogren, DPM  REFERRING DIAG: M72.2 (ICD-10-CM) - Plantar fasciitis of left foot  THERAPY DIAG:  Pain in left foot  Difficulty in walking, not elsewhere classified  Rationale for Evaluation and Treatment: Rehabilitation  ONSET DATE: >30 years ago, exacerbation this year  SUBJECTIVE:   SUBJECTIVE STATEMENT: I am getting new inserts.  I have done shots and It didn't do much  I did the brace and it started bothering me.  0-1/10. Shot has been effective. Pt declines TPDN and at good level. I am doing well.    EVAL-Pt states he has had plantar fasciitis for 34 years after an episode while he was walking [place]foot [place] the beach. Was managing well with orthotics and injections, tends to have  flareups every 10-15 years. Pt states that he retired a few years ago, and during this time has been walking barefoot around his house which seems to have flared things up over the past year or so. States he has had two injections since September, the latter of which (mid October) seems to have provided some better relief particularly over the past few days, but he also notes he has been using insert more and made a minor change to the heel portion. He states he is getting fitted for a new orthotic this week. When barefoot can usually walk about or less before pain hits, but this is variable. Walks daily.  No N/T, no swelling, no referred pain.   PERTINENT HISTORY: HIV  PAIN:  Are you having pain: 0/10 Location/description: L arch Best-worst over past week: 0-1.5/10  - aggravating factors: WB without bracing, pressure - Easing factors: injections, bracing, ice, stretching    PRECAUTIONS: HIV  WEIGHT BEARING RESTRICTIONS: No  FALLS:  Has patient fallen in last 6 months? 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  DIAGNOSTIC FINDINGS:  L foot XR 10/15/22, refer to podiatry notes  PATIENT SURVEYS:  FOTO 84 current, 81 predicted  COGNITION: Overall cognitive status: Within functional limits for tasks assessed     SENSATION: Denies sensory complaints  EDEMA:  No apparent edema on exam today  PALPATION: No TTP on exam today  LOWER EXTREMITY ROM:  Active ROM Right eval Left eval  Knee flexion    Knee extension    Ankle dorsiflexion 10 deg 4 deg  Ankle plantarflexion 50 deg 52 deg  Ankle inversion 20 deg 22 deg  Ankle eversion 15 deg 10 deg    (Blank rows = not tested) Comments:  mild subjective stiffness of L hallux passively, nonpainful  LOWER EXTREMITY MMT:    MMT Right eval Left eval  Knee flexion    Knee extension    Ankle dorsiflexion 5 5  Ankle plantarflexion    Ankle inversion 5 5  Ankle eversion 5 5   (Blank rows = not tested) (Key: WFL = within functional limits not formally assessed, * = concordant pain, s = stiffness/stretching sensation, NT = not tested)  Comments:    GAIT: Distance walked: within clinic Assistive device utilized: None Level of assistance: Complete Independence Comments: gait mechanics grossly WNL   TODAY'S TREATMENT:   [facility] Adult PT Treatment:                                                DATE: 12-25-22 Therapeutic Exercise: STS with 25# KB with VC for proper hip hinge and lift with legs Long Sitting Ankle Eversion with GTB 5  hold 2 x 10 on left Long Sitting Ankle Inversion with GTB 1 x  10 reps - 5 hold on left Long Sitting Ankle Dorsiflexion with GTB  1 x 10 reps - 5 hold Downward Dog   1 x 10 reps with extra stretch of  great toe 45 sec isometric hold for heel raise at pain point 3 x 45 sec with 45 sec rest Gastroc Stretch on Wall   2 x 30sec hold Left Heel Raises Bil with wall support 2 x 10 SL heel raise on Right and LEFT  2 x 10 Standing BIL Heel Raise on Step  10 reps Alternating Single Leg Bridge 3 sets - 10 reps Bridge Walk Out   2 x 8 reps                                                                                                                        [facility] Adult PT Treatment:  DATE: 12/15/22 Therapeutic Exercise: Heel raises, gastroc stretch practice reps, cues for appropriate performance  HEP handout + education on appropriate performance, rationale for interventions, relevant anatomy/physiology     PATIENT EDUCATION:  Education details: Pt education on PT impairments, prognosis, and POC. Informed consent.  Rationale for interventions, safe/appropriate HEP performance Person educated: Patient Education method: Explanation, Demonstration, Tactile cues, Verbal cues Education comprehension: verbalized understanding, returned demonstration, verbal cues required, tactile cues required, and needs further education    HOME EXERCISE PROGRAM: Access Code: [email or web address] URL: [email or web address] Date: 12/25/2022 Prepared by: Garen Lah  Program Notes IF you have a pain point that is sharper, do heel raise to pain point as shown in clinic for 45 sec x 5 with 45 sec rest in between. instead of heel raises until pain subsides.Do progression from bil heel raise to single heel raise to off step  Exercises - Long Sitting Ankle Eversion with Resistance  - 1 x daily - 7 x weekly - 3 sets - 10 reps - 5  hold - Long Sitting Ankle Inversion with Resistance  - 1 x daily - 7 x weekly - 3 sets - 10 reps - 5 hold - Long Sitting Ankle Dorsiflexion with Anchored Resistance  - 1 x daily - 7 x weekly - 3 sets - 10 reps - 5 hold - Downward Dog  - 1 x daily - 7 x weekly - 3 sets - 10 reps - Gastroc Stretch on Wall  - 2-3 x daily - 1 sets - 1-2 reps - 30sec hold - Heel Raises with Counter Support  - 2-3 x daily - 1 sets - 12-15 reps - Single Leg Heel Raise with Unilateral Counter Support  - 1 x daily - 7 x weekly - 3 sets - 10 reps - Standing Bilateral Heel Raise on Step  - 1 x daily - 7 x weekly - 3-4 sets - 10 reps - Goblet Squat with Kettlebell  - 1 x daily - 7 x weekly - 3 sets - 10-12 reps - Alternating Single Leg Bridge  - 1 x daily - 7 x weekly - 3 sets - 10 reps - Bridge Walk Out  - 1 x daily - 7 x weekly - 3 sets - 6-12 reps  ASSESSMENT:  CLINICAL IMPRESSION:  Pt presents with 1/10 pain today and is pleased with shot and progress made.  Pt has no issues with initial HEP and was interested in challenge and strengthening.  Pt HEP updated and return demo of all exercise with no adverse reactions  and given written handout. Pt left with no further questions and pleased with current level of function.  EVAL- Patient is a pleasant 67 y.o. gentleman who was seen today for physical therapy evaluation and treatment for L plantar fascia issues ongoing over past 34 years. He notes flareups tend to occur every 10-15 years and this episode was preceded by increased time walking barefoot, has improved recently with injections and increased brace use. On exam he does demonstrate reduced mobility of symptomatic ankle although symptoms are not irritable today. Also has some subjective stiffness of L hallux although nonpainful. Does well with HEP, requires minimal cueing to reduce compensations. No adverse events. Recommend skilled PT to address ankle mobility and improve endurance of calf, foot intrinsic/extrinsic musculature to address pt goal of being able to improve walking tolerance around home without external support. Pt departs today's session in no acute distress, all voiced questions/concerns addressed appropriately from PT perspective.  OBJECTIVE IMPAIRMENTS: decreased activity tolerance, decreased endurance, decreased mobility, difficulty walking, decreased ROM, and pain.   ACTIVITY LIMITATIONS: standing, transfers, and locomotion level  PARTICIPATION LIMITATIONS:  denies overt limitations  PERSONAL FACTORS: Time since onset of injury/illness/exacerbation are also affecting patient's functional outcome.   REHAB POTENTIAL: Good  CLINICAL DECISION MAKING: Stable/uncomplicated  EVALUATION COMPLEXITY: Low   GOALS: Goals reviewed with patient? No - did discuss role of PT/POC  SHORT TERM GOALS: Target date: 01/15/2023 1.  Pt will demonstrate grossly symmetrical ankle dorsiflexion AROM in order to facilitate improved tolerance to functional movements and gait. Baseline: see ROM chart above Goal status: INITIAL  2.  Pt will demonstrate appropriate performance of final prescribed HEP in order  to facilitate improved self-management of symptoms post-discharge.   Baseline: initial HEP prescribed  Goal status: INITIAL    3. Pt will report improved walking tolerance of at least half an hour without external support in order to facilitate improved tolerance to home navigation and activities within home.  Baseline: ~76min avg tolerance without external support per pt   Goal status: INITIAL   PLAN:  PT FREQUENCY: 1x/week  PT DURATION: 4 weeks  PLANNED INTERVENTIONS: 97164- PT Re-evaluation, 97110-Therapeutic exercises, 97530- Therapeutic activity, 97112- Neuromuscular re-education, 97535- Self Care, 53664- Manual therapy, [phone]- Gait training, Patient/Family education, Balance training, Stair training, Taping, Dry Needling, Joint mobilization, Cryotherapy, and Moist heat  PLAN FOR NEXT SESSION: Review/update HEP PRN. Work on [employer] exercises as appropriate with emphasis on calf endurance, foot intrinsic/extrinsic training with WB progressions as able. Symptom modification strategies as indicated/appropriate.    Garen Lah, PT, ATRIC Certified Exercise Expert for the Aging Adult  12/25/22 11:55 AM Phone: [phone] Fax: [phone]

## 2023-01-01 ENCOUNTER — Inpatient Hospital Stay: Payer: Medicare Other | Admitting: Family Medicine

## 2023-01-01 ENCOUNTER — Ambulatory Visit: Payer: Medicare Other | Admitting: Physical Therapy

## 2023-01-05 ENCOUNTER — Ambulatory Visit: Payer: Medicare Other | Admitting: Physical Therapy

## 2023-01-14 NOTE — Therapy (Incomplete)
OUTPATIENT PHYSICAL THERAPY LOWER EXTREMITY TREATMENT   Patient Name: Charles [name] MRN: [id number] DOB:25-Oct-1955, 67 y.o., male Today's Date: 01/14/2023  END OF SESSION:     Past Medical History:  Diagnosis Date   Arthritis    fingers,left knee   Hemorrhoids, internal, with bleeding 06/23/2018   HIV infection (HCC)    Phreesia 08/02/2019   HIV positive (HCC)    Personal history of colonic adenoma 02/24/2013   Plantar fasciitis    Past Surgical History:  Procedure Laterality Date   COLONOSCOPY     HEMORRHOID BANDING     PROSTATE BIOPSY     TONSILLECTOMY     at age 45   Patient Active Problem List   Diagnosis Date Noted   History of colonic polyps 02/24/2013   HIV (human immunodeficiency virus infection) (HCC) 05/01/2011    PCP: Shade Flood, MD  REFERRING PROVIDER: Louann Sjogren, DPM  REFERRING DIAG: M72.2 (ICD-10-CM) - Plantar fasciitis of left foot  THERAPY DIAG:  No diagnosis found.  Rationale for Evaluation and Treatment: Rehabilitation  ONSET DATE: >30 years ago, exacerbation this year  SUBJECTIVE:   SUBJECTIVE STATEMENT: 01/14/2023 ***  *** I am getting new inserts.  I have done shots and It didn't do much  I did the brace and it started bothering me.  0-1/10. Shot has been effective. Pt declines TPDN and at good level. I am doing well.    EVAL-Pt states he has had plantar fasciitis for 34 years after an episode while he was walking [place]foot [place] the beach. Was managing well with orthotics and injections, tends to have flareups every 10-15 years. Pt states that he retired a few years ago, and during this time has been walking barefoot around his house which seems to have flared things up over the past year or so. States he has had two injections since September, the latter of which (mid October) seems to have provided some better relief particularly over the past few days, but he also notes he has been using insert more and made a minor change  to the heel portion. He states he is getting fitted for a new orthotic this week. When barefoot can usually walk about or less before pain hits, but this is variable. Walks daily.  No N/T, no swelling, no referred pain.   PERTINENT HISTORY: HIV  PAIN:  Are you having pain: 0/10 Location/description: L arch Best-worst over past week: 0-1.5/10  - aggravating factors: WB without bracing, pressure - Easing factors: injections, bracing, ice, stretching    PRECAUTIONS: HIV  WEIGHT BEARING RESTRICTIONS: No  FALLS:  Has patient fallen in last 6 months? 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  DIAGNOSTIC FINDINGS:  L foot XR 10/15/22, refer to podiatry notes  PATIENT SURVEYS:  FOTO 84 current, 81 predicted  COGNITION: Overall cognitive status: Within functional limits for tasks assessed     SENSATION: Denies sensory complaints  EDEMA:  No apparent edema on exam today  PALPATION: No TTP on exam today  LOWER EXTREMITY ROM:  Active ROM Right eval Left eval  Knee flexion    Knee extension    Ankle dorsiflexion 10 deg 4 deg  Ankle plantarflexion  50 deg 52 deg  Ankle inversion 20 deg 22 deg  Ankle eversion 15 deg 10 deg   (Blank rows = not tested) Comments:  mild subjective stiffness of L hallux passively, nonpainful  LOWER EXTREMITY MMT:    MMT Right eval Left eval  Knee flexion    Knee extension    Ankle dorsiflexion 5 5  Ankle plantarflexion    Ankle inversion 5 5  Ankle eversion 5 5   (Blank rows = not tested) (Key: WFL = within functional limits not formally assessed, * = concordant pain, s = stiffness/stretching sensation, NT =  not tested)  Comments:    GAIT: Distance walked: within clinic Assistive device utilized: None Level of assistance: Complete Independence Comments: gait mechanics grossly WNL   TODAY'S TREATMENT:   [facility] Adult PT Treatment:                                                DATE: 01/15/23 Therapeutic Exercise: *** Manual Therapy: *** Neuromuscular re-ed: *** Therapeutic Activity: *** Modalities: *** Self Care: ***    [facility] Adult PT Treatment:                                                DATE: 12-25-22 Therapeutic Exercise: STS with 25# KB with VC for proper hip hinge and lift with legs Long Sitting Ankle Eversion with GTB 5  hold 2 x 10 on left Long Sitting Ankle Inversion with GTB 1 x  10 reps - 5 hold on left Long Sitting Ankle Dorsiflexion with GTB  1 x 10 reps - 5 hold Downward Dog   1 x 10 reps with extra stretch of  great toe 45 sec isometric hold for heel raise at pain point 3 x 45 sec with 45 sec rest Gastroc Stretch on Wall   2 x 30sec hold Left Heel Raises Bil with wall support 2 x 10 SL heel raise on Right and LEFT  2 x 10 Standing BIL Heel Raise on Step  10 reps Alternating Single Leg Bridge 3 sets - 10 reps Bridge Walk Out   2 x 8 reps                                                                                                                        [facility] Adult PT Treatment:                                                DATE: 12/15/22 Therapeutic Exercise: Heel raises, gastroc stretch practice reps, cues for appropriate performance  HEP handout +  education on appropriate performance, rationale for interventions, relevant anatomy/physiology     PATIENT EDUCATION:  Education details: rationale for interventions, HEP  Person educated: Patient Education method: Explanation, Demonstration, Tactile cues, Verbal cues Education comprehension: verbalized understanding, returned demonstration, verbal cues required, tactile cues required, and needs further  education     HOME EXERCISE PROGRAM: Access Code: [email or web address] URL: [email or web address] Date: 12/25/2022 Prepared by: Garen Lah  Program Notes IF you have a pain point that is sharper, do heel raise to pain point as shown in clinic for 45 sec x 5 with 45 sec rest in between. instead of heel raises until pain subsides.Do progression from bil heel raise to single heel raise to off step  Exercises - Long Sitting Ankle Eversion with Resistance  - 1 x daily - 7 x weekly - 3 sets - 10 reps - 5  hold - Long Sitting Ankle Inversion with Resistance  - 1 x daily - 7 x weekly - 3 sets - 10 reps - 5 hold - Long Sitting Ankle Dorsiflexion with Anchored Resistance  - 1 x daily - 7 x weekly - 3 sets - 10 reps - 5 hold - Downward Dog  - 1 x daily - 7 x weekly - 3 sets - 10 reps - Gastroc Stretch on Wall  - 2-3 x daily - 1 sets - 1-2 reps - 30sec hold - Heel Raises with Counter Support  - 2-3 x daily - 1 sets - 12-15 reps - Single Leg Heel Raise with Unilateral Counter Support  - 1 x daily - 7 x weekly - 3 sets - 10 reps - Standing Bilateral Heel Raise on Step  - 1 x daily - 7 x weekly - 3-4 sets - 10 reps - Goblet Squat with Kettlebell  - 1 x daily - 7 x weekly - 3 sets - 10-12 reps - Alternating Single Leg Bridge  - 1 x daily - 7 x weekly - 3 sets - 10 reps - Bridge Walk Out  - 1 x daily - 7 x weekly - 3 sets - 6-12 reps  ASSESSMENT:  CLINICAL IMPRESSION:  01/14/2023 ***  *** Pt presents with 1/10 pain today and is pleased with shot and progress made.  Pt has no issues with initial HEP and was interested in challenge and strengthening.  Pt HEP updated and return demo of all exercise with no adverse reactions and given written handout. Pt left with no further questions and pleased with current level of function.  EVAL- Patient is a pleasant 67 y.o. gentleman who was seen today for physical therapy evaluation and treatment for L plantar fascia issues ongoing over past 34 years. He  notes flareups tend to occur every 10-15 years and this episode was preceded by increased time walking barefoot, has improved recently with injections and increased brace use. On exam he does demonstrate reduced mobility of symptomatic ankle although symptoms are not irritable today. Also has some subjective stiffness of L hallux although nonpainful. Does well with HEP, requires minimal cueing to reduce compensations. No adverse events. Recommend skilled PT to address ankle mobility and improve endurance of calf, foot intrinsic/extrinsic musculature to address pt goal of being able to improve walking tolerance around home without external support. Pt departs today's session in no acute distress, all voiced questions/concerns addressed appropriately from PT perspective.    OBJECTIVE IMPAIRMENTS: decreased activity tolerance, decreased endurance, decreased mobility, difficulty walking, decreased ROM, and pain.   ACTIVITY LIMITATIONS: standing, transfers, and locomotion level  PARTICIPATION LIMITATIONS:  denies overt limitations  PERSONAL FACTORS: Time since onset of injury/illness/exacerbation are also affecting patient's functional outcome.   REHAB POTENTIAL: Good  CLINICAL DECISION MAKING: Stable/uncomplicated  EVALUATION COMPLEXITY: Low   GOALS: Goals reviewed with patient? No - did discuss role of PT/POC  SHORT TERM GOALS: Target date: 01/15/2023 1.  Pt will demonstrate grossly symmetrical ankle dorsiflexion AROM in order to facilitate improved tolerance to functional movements and gait. Baseline: see ROM chart above 01/15/23: *** Goal status: ***  2.  Pt will demonstrate appropriate performance of final prescribed HEP in order to facilitate improved self-management of symptoms post-discharge.   Baseline: initial HEP prescribed 01/15/23: *** Goal status: ***  3. Pt will report improved walking tolerance of at least half an hour without external support in order to facilitate improved  tolerance to home navigation and activities within home.  Baseline: ~37min avg tolerance without external support per pt  01/15/23: *** Goal status: ***   PLAN:  PT FREQUENCY: 1x/week  PT DURATION: 4 weeks  PLANNED INTERVENTIONS: 97164- PT Re-evaluation, 97110-Therapeutic exercises, 97530- Therapeutic activity, 97112- Neuromuscular re-education, 97535- Self Care, 27062- Manual therapy, [phone]- Gait training, Patient/Family education, Balance training, Stair training, Taping, Dry Needling, Joint mobilization, Cryotherapy, and Moist heat  PLAN FOR NEXT SESSION: Review/update HEP PRN. Work on [employer] exercises as appropriate with emphasis on calf endurance, foot intrinsic/extrinsic training with WB progressions as able. Symptom modification strategies as indicated/appropriate.    Ashley Murrain PT, DPT 01/14/2023 8:48 AM

## 2023-01-15 ENCOUNTER — Ambulatory Visit: Payer: Medicare Other | Admitting: Physical Therapy

## 2023-01-26 ENCOUNTER — Encounter: Payer: Self-pay | Admitting: Podiatry

## 2023-01-26 ENCOUNTER — Ambulatory Visit (INDEPENDENT_AMBULATORY_CARE_PROVIDER_SITE_OTHER): Payer: Commercial Managed Care - PPO | Admitting: Podiatry

## 2023-01-26 DIAGNOSIS — M722 Plantar fascial fibromatosis: Secondary | ICD-10-CM | POA: Diagnosis not present

## 2023-01-26 NOTE — Progress Notes (Signed)
  Subjective:  Patient ID: Charles [name], male    DOB: 07-Jul-1955,   MRN: [id number]  Chief Complaint  Patient presents with   Plantar Fasciitis    Pt presents for pf follow up states  he is doing so much better.    67 y.o. male presents for follow-up of left plantar fasciitis. Relates doing much better and the second shot really helped. States he is 99.5% better. Relates he did do some PT but ended up stopping due to improvement.  . Denies any other pedal complaints. Denies n/v/f/c.   Past Medical History:  Diagnosis Date   Arthritis    fingers,left knee   Hemorrhoids, internal, with bleeding 06/23/2018   HIV infection (HCC)    Phreesia 08/02/2019   HIV positive (HCC)    Personal history of colonic adenoma 02/24/2013   Plantar fasciitis     Objective:  Physical Exam: Vascular: DP/PT pulses 2/4 bilateral. CFT <3 seconds. Normal hair growth on digits. No edema.  Skin. No lacerations or abrasions bilateral feet.  Musculoskeletal: MMT 5/5 bilateral lower extremities in DF, PF, Inversion and Eversion. Deceased ROM in DF of ankle joint. Non tender to medial calcaneal tubercle on the left. No pain with calcaneal squeeze. No pain along PT achilles or arch.   Neurological: Sensation intact to light touch.   Assessment:   1. Plantar fasciitis of left foot       Plan:  Patient was evaluated and treated and all questions answered. Discussed plantar fasciitis with patient.  X-rays reviewed and discussed with patient. No acute fractures or dislocations noted. Mild spurring noted at inferior calcaneus.  Discussed treatment options including, ice, NSAIDS, supportive shoes, bracing, and stretching. Continue stretching support and anti-inflammatories as needed Follow-up as needed   Louann Sjogren, DPM

## 2023-01-29 ENCOUNTER — Ambulatory Visit: Payer: Medicare Other

## 2023-01-29 NOTE — Progress Notes (Signed)
 Patient presents today to pick up custom molded foot orthotics, diagnosed with PF by Dr. Ralene Cork.   Orthotics were dispensed and fit was satisfactory. Reviewed instructions for break-in and wear. Written instructions given to patient.  Patient will follow up as needed.  Addison Bailey Cped, CFo, CFm

## 2023-03-28 LAB — LAB REPORT - SCANNED
Chlamydia, Swab/Urine, PCR: NEGATIVE
EGFR: 76

## 2023-04-06 ENCOUNTER — Encounter: Payer: Self-pay | Admitting: Family Medicine

## 2023-04-06 ENCOUNTER — Ambulatory Visit (INDEPENDENT_AMBULATORY_CARE_PROVIDER_SITE_OTHER): Payer: Medicare Other | Admitting: Family Medicine

## 2023-04-06 VITALS — BP 118/60 | HR 83 | Temp 98.6°F | Ht 68.5 in | Wt 226.6 lb

## 2023-04-06 DIAGNOSIS — Z21 Asymptomatic human immunodeficiency virus [HIV] infection status: Secondary | ICD-10-CM

## 2023-04-06 DIAGNOSIS — I10 Essential (primary) hypertension: Secondary | ICD-10-CM

## 2023-04-06 DIAGNOSIS — N529 Male erectile dysfunction, unspecified: Secondary | ICD-10-CM

## 2023-04-06 MED ORDER — AMLODIPINE BESYLATE 2.5 MG PO TABS: 2.5000 mg | ORAL_TABLET | Freq: Every day | ORAL | 2 refills | Status: AC

## 2023-04-06 NOTE — Patient Instructions (Signed)
 Thank you for coming in today.  We will request your recent lab work from the specialist.  If additional labs are needed I will let you know.  No change in amlodipine dose for now.  Follow-up in 6 months.  Let me know if you have questions and take care.

## 2023-04-06 NOTE — Progress Notes (Signed)
 Subjective:  Patient ID: Charles [name], male    DOB: 07-Feb-1956  Age: 68 y.o. MRN: [id number]  CC:  Chief Complaint  Patient presents with   Medical Management of Chronic Issues    Pt is doing well, notes he is fasting     HPI Charles [name] presents for   Follow up.   Bronchitis in November. Resolved. No recurrence.   Hypertension: Started on amlodipine after ED visit in July for elevated blood pressure at dental visit.  130/82 at his visit with me in August 2024.  Borderline potassium previously, started on multivitamin with potassium. On centrum with potassium now.  Home readings: 130/80 range.  No side effects with med.  BP Readings from Last 3 Encounters:  04/06/23 118/60  12/23/22 (!) 157/94  10/15/22 (!) 146/104   Lab Results  Component Value Date   CREATININE 0.92 12/22/2022   History of HIV Followed by infectious disease for HIV treatment, on Triumeq, Dr. Truman Hayward.  [facility].  Erectile dysfunction Treated with Viagra previously - 100 mg. Full pill. No HA/flushing, blue vision or hearing changes. No CP/DOE.  Rx from [facility].  Had many labs 2 weeks ago - requested.   Health maintenance Flu vaccine: in November at pharmacy.  COVID booster - declined.  Pneumonia vaccine - has received at infectious disease. Report requested.  Shingles vaccine: declined.  Colonoscopy - 2020 - plan on 7 years - Dr. Leone Payor, diminutive SSP recall 2027.   Immunization History  Administered Date(s) Administered   Influenza,inj,Quad PF,6+ Mos 11/03/2016   Influenza-Unspecified 12/26/2014   PFIZER(Purple Top)SARS-COV-2 Vaccination 04/20/2019, 05/11/2019   Tdap 01/31/2017     History Patient Active Problem List   Diagnosis Date Noted   History of colonic polyps 02/24/2013   HIV (human immunodeficiency virus infection) (HCC) 05/01/2011   Past Medical History:  Diagnosis Date   Arthritis    fingers,left knee   Hemorrhoids, internal, with  bleeding 06/23/2018   HIV infection (HCC)    Phreesia 08/02/2019   HIV positive (HCC)    Personal history of colonic adenoma 02/24/2013   Plantar fasciitis    Past Surgical History:  Procedure Laterality Date   COLONOSCOPY     HEMORRHOID BANDING     PROSTATE BIOPSY     TONSILLECTOMY     at age 9   Allergies  Allergen Reactions   Efavirenz Rash   Emtricitabine-Tenofovir Df Rash   Prednisone Rash    Severe rash    Prior to Admission medications   Medication Sig Start Date End Date Taking? Authorizing Provider  abacavir-dolutegravir-lamiVUDine (TRIUMEQ) 600-50-300 MG tablet Take 1 tablet by mouth daily.   Yes [provider]  amLODipine (NORVASC) 2.5 MG tablet Take 1 tablet (2.5 mg total) by mouth daily. 08/21/22  Yes Laurence Spates, MD  Ascorbic Acid (VITAMIN C) 1000 MG tablet Take 1,000 mg by mouth daily.   Yes [provider]  Boswellia-Glucosamine-Vit D (OSTEO BI-FLEX ONE PER DAY PO) Take 2 tablets by mouth daily.    Yes [provider]  meloxicam (MOBIC) 15 MG tablet Take 1 tablet (15 mg total) by mouth daily. Patient taking differently: Take 15 mg by mouth daily as needed for pain. 11/26/22  Yes Louann Sjogren, DPM  Multiple Vitamin (MULTIVITAMIN) tablet Take 1 tablet by mouth daily.   Yes [provider]  sildenafil (VIAGRA) 100 MG tablet Take 1 tablet by mouth daily as needed. 11/26/22  Yes [provider]  valACYclovir (  VALTREX) 1000 MG tablet Take 1 tablet (1,000 mg total) by mouth daily. For 5 days per occurrence. Patient taking differently: Take 1,000 mg by mouth as needed. For 5 days per occurrence. 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  Eyes:  Negative for visual disturbance.  Respiratory:  Negative for cough, chest tightness and shortness of breath.   Cardiovascular:  Negative for chest pain, palpitations and leg swelling.  Gastrointestinal:  Negative for abdominal pain and blood in stool.  Neurological:  Negative for dizziness, light-headedness and headaches.     Objective:   Vitals:   04/06/23 0942  BP: 118/60  Pulse: 83  Temp: 98.6 F (37 C)  TempSrc: Temporal  SpO2: 96%  Weight: 226 lb 9.6 oz (102.8 kg)  Height: 5' 8.5" (1.74 m)     Physical Exam Vitals reviewed.  Constitutional:      Appearance: He is well-developed.  HENT:     Head: Normocephalic and atraumatic.  Neck:     Vascular: No carotid bruit or JVD.  Cardiovascular:     Rate and Rhythm: Normal rate and regular rhythm.     Heart sounds: Normal heart sounds. No murmur heard. Pulmonary:     Effort: Pulmonary effort is normal.     Breath sounds: Normal breath sounds. No rales.  Musculoskeletal:     Right lower leg: No edema.     Left lower leg: No edema.  Skin:    General: Skin is warm and dry.  Neurological:     Mental Status: He is alert and oriented to person, place, and time.  Psychiatric:        Mood and Affect: Mood normal.        Assessment &  Plan:  Charles [name] is a 68 y.o. male . Essential hypertension  Stable, tolerating current regimen. Medications refilled. Labs performed recently by specialist, requested notes.  Hold on in office labs at this time depending on what labs were checked and results.  92-month follow-up if stable.  Asymptomatic HIV infection, with no history of HIV-related illness (HCC)  -Stable, follow-up with infectious disease as planned, and continues on Triumeq.  Requested records as reports pneumonia vaccine through infectious disease.  Erectile dysfunction, unspecified erectile dysfunction type  -Stable with Viagra.  No new side effects and effective.  Prescribed by other provider as  above.  Meds ordered this encounter  Medications   amLODipine (NORVASC) 2.5 MG tablet    Sig: Take 1 tablet (2.5 mg total) by mouth daily.    Dispense:  90 tablet    Refill:  2   Patient Instructions  Thank you for coming in today.  We will request your recent lab work from the specialist.  If additional labs are needed I will let you know.  No change in amlodipine dose for now.  Follow-up in 6 months.  Let me know if you have questions and take care.     Signed,   Meredith Staggers, MD [facility], [facility] 04/06/23 10:09 AM

## 2023-06-09 ENCOUNTER — Encounter: Payer: Self-pay | Admitting: Student in an Organized Health Care Education/Training Program

## 2023-06-09 ENCOUNTER — Ambulatory Visit: Payer: Self-pay

## 2023-06-09 ENCOUNTER — Ambulatory Visit (INDEPENDENT_AMBULATORY_CARE_PROVIDER_SITE_OTHER): Admitting: Student in an Organized Health Care Education/Training Program

## 2023-06-09 VITALS — BP 120/76 | HR 66 | Wt 224.0 lb

## 2023-06-09 DIAGNOSIS — M25561 Pain in right knee: Secondary | ICD-10-CM | POA: Insufficient documentation

## 2023-06-09 NOTE — Progress Notes (Signed)
   Acute Office Visit  Subjective:     Patient ID: Charles [name], male    DOB: May 22, 1955, 68 y.o.   MRN: [id number]  Chief Complaint  Patient presents with   Knee Pain    Patient fell about 10-11 days ago and has had right knee pain for 9 of those days. Patient is not sure if he hurt his right knee during this or if it can be something else. Missed 2 steps on stairs and did not have nay pain when he first fell or the day after.     HPI  Patient is in today for right knee pain.  Knee became sore and painful about 9 days ago.  He had a fall 2 days prior on the stairs and hit the anterior parts of both legs.  Pain mostly bothers him when he first stands up from a seated position.  It is sore throughout the day especially when he is bearing weight.  Has not been able to run or have exercise.  Has not been functional limiting.  Started to notice some swelling of the right knee yesterday and so came in for evaluation.  No fevers or chills.  No history of gout.  No history of osteoarthritis.  No history of knee injuries or knee surgeries.  Still able to walk and bear weight, not function limiting.      Objective:    BP 120/76   Pulse 66   Wt 224 lb (101.6 kg)   SpO2 96%   BMI 33.56 kg/m    Physical Exam  Gen: Well-appearing man Ext: Right knee has a mild amount of swelling on the medial aspect, no obvious effusion on palpation of the suprapatellar space.  No joint line tenderness.  No anterior crepitus with range of motion.  On standing he has no varus or valgus deformity.  No erythema.  No pain or discomfort with passive range of motion.  Ultrasound: No joint effusion identified on ultrasound.  No bursitis.  No Baker's cyst.      Assessment & Plan:   Problem List Items Addressed This Visit       Unprioritized   Right knee pain - Primary   Acute issue of right knee pain after a fall about 10 days ago.  Not function limiting.  Exam is reassuring.  Very mild swelling, but on  ultrasound there is no joint effusion.  So I think this is probably soft tissue swelling.  No underlying arthritis, or any other high risk features on exam.  I think this has a really good chance of improving over the next 1-2 weeks with supportive care alone.  We talked about safe use of NSAIDs.  I recommended return to exercise and functioning on a gradual basis.  Follow-up with us  if no better in 2 weeks and we can talk about doing x-rays at that time to look for joint space narrowings.       No follow-ups on file.  Ether Hercules, MD

## 2023-06-09 NOTE — Telephone Encounter (Signed)
 Copied from CRM [id number]. Topic: Clinical - Red Word Triage >> Jun 09, 2023 11:59 AM Kita Perish H wrote: [facility] that prompted transfer to Nurse Triage: Swelling in right knee pain when he stands but swelling on inside of knee now   Chief Complaint: Right knee pain/swelling Symptoms: pain/swelling Frequency: 9 days ago Pertinent Negatives: Patient denies chest pain, difficulty breathing, fever, calf pain Disposition: [] ED /[] Urgent Care (no appt availability in office) / [x] Appointment(In office/virtual)/ []  [facility]/ [] Home Care/ [] Refused Recommended Disposition /[] [place] [facility]/ []  Follow-up with PCP Additional Notes: Patient called and advised that about 9 days ago he was walking his usual walk.  He states that he usually walks 1-1.5 a day. The pain lingered around and pain is localized to the medial/anterior aspect. Patient states that two days before his right knee started hurting he did fall down the last two steps on a stairwell.  Patient states that he didn't think he hurt anything at that time but this was the only trauma he could pinpoint related to the pain now. Patient states that his right knee is noticeably swollen compared to his left knee. He denies any chest pain, difficulty breathing, fever, calf pain.  He states that he does not have any pain when he is not bearing weight on this knee. Appointment made for today 06/09/2023 at patient's PCP office at 4 pm with Dr Gayl Katos. Patient is advised that if anything worsens he can go directly to the Emergency Room. Patient verbalized understanding. . Reason for Disposition . [1] MODERATE pain (e.g., interferes with normal activities, limping) AND [2] present > 3 days  Answer Assessment - Initial Assessment Questions 1. LOCATION and RADIATION: "Where is the pain located?"      Right knee pain 2. QUALITY: "What does the pain feel like?"  (e.g., sharp, dull, aching, burning)     Lingering pain 3. SEVERITY: "How  bad is the pain?" "What does it keep you from doing?"   (Scale 1-10; or mild, moderate, severe)   -  MILD (1-3): doesn't interfere with normal activities    -  MODERATE (4-7): interferes with normal activities (e.g., work or school) or awakens from sleep, limping    -  SEVERE (8-10): excruciating pain, unable to do any normal activities, unable to walk     0 pain when no weight is on the knee 4. ONSET: "When did the pain start?" "Does it come and go, or is it there all the time?"     9 days ago after walking 5. RECURRENT: "Have you had this pain before?" If Yes, ask: "When, and what happened then?"     ---- 6. SETTING: "Has there been any recent work, exercise or other activity that involved that part of the body?"      Daily use 7. AGGRAVATING FACTORS: "What makes the knee pain worse?" (e.g., walking, climbing stairs, running)     Bearing weight on it 8. ASSOCIATED SYMPTOMS: "Is there any swelling or redness of the knee?"     Swelling slightly yesterday and a little more today 9. OTHER SYMPTOMS: "Do you have any other symptoms?" (e.g., chest pain, difficulty breathing, fever, calf pain)     No  Protocols used: Knee Pain-A-AH

## 2023-06-09 NOTE — Assessment & Plan Note (Signed)
 Acute issue of right knee pain after a fall about 10 days ago.  Not function limiting.  Exam is reassuring.  Very mild swelling, but on ultrasound there is no joint effusion.  So I think this is probably soft tissue swelling.  No underlying arthritis, or any other high risk features on exam.  I think this has a really good chance of improving over the next 1-2 weeks with supportive care alone.  We talked about safe use of NSAIDs.  I recommended return to exercise and functioning on a gradual basis.  Follow-up with us  if no better in 2 weeks and we can talk about doing x-rays at that time to look for joint space narrowings.

## 2023-10-08 ENCOUNTER — Encounter: Payer: Medicare Other | Admitting: Family Medicine

## 2024-01-27 ENCOUNTER — Ambulatory Visit

## 2024-01-27 VITALS — Ht 68.5 in | Wt 195.0 lb

## 2024-01-27 DIAGNOSIS — Z0000 Encounter for general adult medical examination without abnormal findings: Secondary | ICD-10-CM

## 2024-01-27 NOTE — Progress Notes (Signed)
 Chief Complaint  Patient presents with   Medicare Wellness     Subjective:   Charles [name] is a 68 y.o. male who presents for a Medicare Annual Wellness Visit.  Visit info / Clinical Intake: Medicare Wellness Visit Type:: Initial Annual Wellness Visit Persons participating in visit and providing information:: patient Medicare Wellness Visit Mode:: Telephone If telephone:: video declined Since this visit was completed virtually, some vitals may be partially provided or unavailable. Missing vitals are due to the limitations of the virtual format.: Documented vitals are patient reported If Telephone or Video please confirm:: I connected with patient using audio/video enable telemedicine. I verified patient identity with two identifiers, discussed telehealth limitations, and patient agreed to proceed. Patient Location:: Home Provider Location:: Home Interpreter Needed?: No Pre-visit prep was completed: yes AWV questionnaire completed by patient prior to visit?: no Living arrangements:: lives with spouse/significant other Patient's Overall Health Status Rating: excellent Typical amount of pain: none Does pain affect daily life?: no Are you currently prescribed opioids?: no  Dietary Habits and Nutritional Risks How many meals a day?: 3 Eats fruit and vegetables daily?: yes Most meals are obtained by: preparing own meals In the last 2 weeks, have you had any of the following?: none Diabetic:: no  Functional Status Activities of Daily Living (to include ambulation/medication): Independent Ambulation: Independent Medication Administration: Independent Home Management (perform basic housework or laundry): Independent Manage your own finances?: yes Primary transportation is: driving Concerns about vision?: no *vision screening is required for WTM* Concerns about hearing?: no  Fall Screening Falls in the past year?: 0 Number of falls in past year: 0 Was there an injury with  Fall?: 0 Fall Risk Category Calculator: 0 Patient Fall Risk Level: Low Fall Risk  Fall Risk Patient at Risk for Falls Due to: No Fall Risks Fall risk Follow up: Falls evaluation completed; Falls prevention discussed  Home and Transportation Safety: All rugs have non-skid backing?: N/A, no rugs All stairs or steps have railings?: yes (steps outside) Grab bars in the bathtub or shower?: yes Have non-skid surface in bathtub or shower?: yes Good home lighting?: yes Regular seat belt use?: yes Hospital stays in the last year:: no  Cognitive Assessment Difficulty concentrating, remembering, or making decisions? : no Will 6CIT or Mini Cog be Completed: no 6CIT or Mini Cog Declined: patient alert, oriented, able to answer questions appropriately and recall recent events  Advance Directives (For Healthcare) Does Patient Have a Medical Advance Directive?: No  Reviewed/Updated  Reviewed/Updated: Reviewed All (Medical, Surgical, Family, Medications, Allergies, Care Teams, Patient Goals)    Allergies (verified) Efavirenz, Emtricitabine-tenofovir df, and Prednisone    Current Medications (verified) Outpatient Encounter Medications as of 01/27/2024  Medication Sig   abacavir-dolutegravir-lamiVUDine (TRIUMEQ) 600-50-300 MG tablet Take 1 tablet by mouth daily.   amLODipine  (NORVASC ) 2.5 MG tablet Take 1 tablet (2.5 mg total) by mouth daily.   Ascorbic Acid (VITAMIN C) 1000 MG tablet Take 1,000 mg by mouth daily.   Boswellia-Glucosamine-Vit D (OSTEO BI-FLEX ONE PER DAY PO) Take 2 tablets by mouth daily.    Multiple Vitamin (MULTIVITAMIN) tablet Take 1 tablet by mouth daily.   sildenafil (VIAGRA) 100 MG tablet Take 1 tablet by mouth daily as needed.   valACYclovir  (VALTREX ) 1000 MG tablet Take 1 tablet (1,000 mg total) by mouth daily. For 5 days per occurrence. (Patient taking differently: Take 1,000 mg by mouth as needed. For 5 days per occurrence.)   No facility-administered encounter  medications on file as  of 01/27/2024.    History: Past Medical History:  Diagnosis Date   Arthritis    fingers,left knee   Hemorrhoids, internal, with bleeding 06/23/2018   HIV infection (HCC)    [employer] 08/02/2019   HIV positive (HCC)    Personal history of colonic adenoma 02/24/2013   Plantar fasciitis    Past Surgical History:  Procedure Laterality Date   COLONOSCOPY     HEMORRHOID BANDING     PROSTATE BIOPSY     TONSILLECTOMY     at age 4   Family History  Problem Relation Age of Onset   Pancreatic cancer Mother    Diabetes Mother    Bladder Cancer Father        smoker in his early days-cigar   Kidney disease Father        on dyalsis   Prostate cancer Father        had seed implant   Diabetes Father    Colon cancer Neg Hx    Esophageal cancer Neg Hx    Stomach cancer Neg Hx    Rectal cancer Neg Hx    Social History   Occupational History   Occupation: clinical research associate   Occupation: RETIRED  Tobacco Use   Smoking status: Never   Smokeless tobacco: Never  Vaping Use   Vaping status: Never Used  Substance and Sexual Activity   Alcohol use: No   Drug use: No   Sexual activity: Not on file   Tobacco Counseling Counseling given: Not Answered  SDOH Screenings   Food Insecurity: No Food Insecurity (01/27/2024)  Housing: Unknown (01/27/2024)  Transportation Needs: No Transportation Needs (01/27/2024)  Utilities: Not At Risk (01/27/2024)  Depression (PHQ2-9): Low Risk (01/27/2024)  Physical Activity: Insufficiently Active (01/27/2024)  Social Connections: Moderately Integrated (01/27/2024)  Stress: No Stress Concern Present (01/27/2024)  Tobacco Use: Low Risk (01/27/2024)  Health Literacy: Adequate Health Literacy (01/27/2024)   See flowsheets for full screening details  Depression Screen PHQ 2 & 9 Depression Scale- Over the past 2 weeks, how often have you been bothered by any of the following problems? Little interest or pleasure in doing things:  0 Feeling down, depressed, or hopeless (PHQ Adolescent also includes...irritable): 0 PHQ-2 Total Score: 0 Trouble falling or staying asleep, or sleeping too much: 0 Feeling tired or having little energy: 0 Poor appetite or overeating (PHQ Adolescent also includes...weight loss): 0 Feeling bad about yourself - or that you are a failure or have let yourself or your family down: 0 Trouble concentrating on things, such as reading the newspaper or watching television (PHQ Adolescent also includes...like school work): 0 Moving or speaking so slowly that other people could have noticed. Or the opposite - being so fidgety or restless that you have been moving around a lot more than usual: 0 Thoughts that you would be better off dead, or of hurting yourself in some way: 0 PHQ-9 Total Score: 0 If you checked off any problems, how difficult have these problems made it for you to do your work, take care of things at home, or get along with other people?: Not difficult at all  Depression Treatment Depression Interventions/Treatment : EYV7-0 Score <4 Follow-up Not Indicated     Goals Addressed               This Visit's Progress     Patient Stated (pt-stated)        To continue to lose some weight/would like to be down 175 lb  Objective:    Today's Vitals   01/27/24 1531  Weight: 195 lb (88.5 kg)  Height: 5' 8.5 (1.74 m)   Body mass index is 29.22 kg/m.  Hearing/Vision screen Hearing Screening - Comments:: Denies hearing difficulties   Vision Screening - Comments:: Denies vision issues./no provider  Immunizations and Health Maintenance Health Maintenance  Topic Date Due   Zoster Vaccines- Shingrix (1 of 2) Never done   COVID-19 Vaccine (3 - Pfizer risk series) 06/08/2019   Pneumococcal Vaccine: 50+ Years (3 of 3 - PCV) 03/21/2022   Colonoscopy  04/23/2023   Influenza Vaccine  09/11/2023   Medicare Annual Wellness (AWV)  01/26/2025   DTaP/Tdap/Td (2 - Td or  Tdap) 02/01/2027   Hepatitis C Screening  Completed   Meningococcal B Vaccine  Aged Out   Hepatitis B Vaccines 19-59 Average Risk  Discontinued        Assessment/Plan:  This is a routine wellness examination for [place].  Patient Care Team: Levora Reyes SAUNDERS, MD as PCP - General (Family Medicine) [facility]  I have personally reviewed and noted the following in the patients chart:   Medical and social history Use of alcohol, tobacco or illicit drugs  Current medications and supplements including opioid prescriptions. Functional ability and status Nutritional status Physical activity Advanced directives List of other physicians Hospitalizations, surgeries, and ER visits in previous 12 months Vitals Screenings to include cognitive, depression, and falls Referrals and appointments  No orders of the defined types were placed in this encounter.  In addition, I have reviewed and discussed with patient certain preventive protocols, quality metrics, and best practice recommendations. A written personalized care plan for preventive services as well as general preventive health recommendations were provided to patient.   Jaidyn Usery L Kenlynn Houde, CMA   01/27/2024   Return in 1 year (on 01/26/2025).  After Visit Summary: (Mail) Due to this being a telephonic visit, the after visit summary with patients personalized plan was offered to patient via mail   Nurse Notes: Patient is due for a Shingrix vaccine.  He stated that he actually has 7 years until next colonoscopy not 5 years. Patient also stated that he has had his pneumonia vaccine, however, it is not documented in the [facility].  He had no other concerns to address today.

## 2024-01-27 NOTE — Patient Instructions (Signed)
 Charles [name],  Thank you for taking the time for your Medicare Wellness Visit. I appreciate your continued commitment to your health goals. Please review the care plan we discussed, and feel free to reach out if I can assist you further.  Please note that Annual Wellness Visits do not include a physical exam. Some assessments may be limited, especially if the visit was conducted virtually. If needed, we may recommend an in-person follow-up with your provider.  Ongoing Care Seeing your primary care provider every 3 to 6 months helps us  monitor your health and provide consistent, personalized care. Last office visit on 06/09/2023.  You are due for a shingles vaccine and can get that done at your local pharmacy.  Keep up the good work.  Referrals If a referral was made during today's visit and you haven't received any updates within two weeks, please contact the referred provider directly to check on the status.  Recommended Screenings:  Health Maintenance  Topic Date Due   Medicare Annual Wellness Visit  Never done   Zoster (Shingles) Vaccine (1 of 2) Never done   COVID-19 Vaccine (3 - Pfizer risk series) 06/08/2019   Pneumococcal Vaccine for age over 53 (3 of 3 - PCV) 03/21/2022   Colon Cancer Screening  04/23/2023   Flu Shot  09/11/2023   DTaP/Tdap/Td vaccine (2 - Td or Tdap) 02/01/2027   Hepatitis C Screening  Completed   Meningitis B Vaccine  Aged Out   Hepatitis B Vaccine  Discontinued       01/27/2024    3:42 PM  Advanced Directives  Does Patient Have a Medical Advance Directive? No    Vision: Annual vision screenings are recommended for early detection of glaucoma, cataracts, and diabetic retinopathy. These exams can also reveal signs of chronic conditions such as diabetes and high blood pressure.  Dental: Annual dental screenings help detect early signs of oral cancer, gum disease, and other conditions linked to overall health, including heart disease and diabetes.  Please  see the attached documents for additional preventive care recommendations.

## 2024-01-31 ENCOUNTER — Other Ambulatory Visit: Payer: Self-pay | Admitting: Family Medicine

## 2024-02-01 NOTE — Telephone Encounter (Signed)
 Called patient to schedule a follow up visit with Dr. Levora. Left vm to return call
